# Patient Record
Sex: Female | Born: 1990 | Race: Black or African American | Hispanic: No | Marital: Single | Smoking: Never smoker
Health system: Southern US, Community
[De-identification: ages and names within clinical notes are randomized; demographics above are authoritative.]

## PROBLEM LIST (undated history)

## (undated) ENCOUNTER — Emergency Department (HOSPITAL_BASED_OUTPATIENT_CLINIC_OR_DEPARTMENT_OTHER): Admission: EM | Payer: Medicaid Other | Source: Home / Self Care

## (undated) DIAGNOSIS — G43909 Migraine, unspecified, not intractable, without status migrainosus: Secondary | ICD-10-CM

## (undated) DIAGNOSIS — I10 Essential (primary) hypertension: Secondary | ICD-10-CM

## (undated) HISTORY — PX: OTHER SURGICAL HISTORY: SHX169

## (undated) HISTORY — PX: HAND SURGERY: SHX662

## (undated) HISTORY — PX: FRACTURE SURGERY: SHX138

---

## 2009-05-21 ENCOUNTER — Emergency Department (HOSPITAL_BASED_OUTPATIENT_CLINIC_OR_DEPARTMENT_OTHER): Admission: EM | Admit: 2009-05-21 | Discharge: 2009-05-21 | Payer: Self-pay | Admitting: Emergency Medicine

## 2009-06-19 ENCOUNTER — Emergency Department (HOSPITAL_BASED_OUTPATIENT_CLINIC_OR_DEPARTMENT_OTHER): Admission: EM | Admit: 2009-06-19 | Discharge: 2009-06-19 | Payer: Self-pay | Admitting: Emergency Medicine

## 2009-12-19 ENCOUNTER — Emergency Department (HOSPITAL_BASED_OUTPATIENT_CLINIC_OR_DEPARTMENT_OTHER): Admission: EM | Admit: 2009-12-19 | Discharge: 2009-12-19 | Payer: Self-pay | Admitting: Emergency Medicine

## 2010-03-10 ENCOUNTER — Emergency Department (HOSPITAL_BASED_OUTPATIENT_CLINIC_OR_DEPARTMENT_OTHER): Admission: EM | Admit: 2010-03-10 | Discharge: 2010-03-10 | Payer: Self-pay | Admitting: Emergency Medicine

## 2010-10-23 ENCOUNTER — Emergency Department (HOSPITAL_BASED_OUTPATIENT_CLINIC_OR_DEPARTMENT_OTHER): Admission: EM | Admit: 2010-10-23 | Discharge: 2010-10-23 | Payer: Self-pay | Admitting: Emergency Medicine

## 2011-02-13 ENCOUNTER — Emergency Department (HOSPITAL_BASED_OUTPATIENT_CLINIC_OR_DEPARTMENT_OTHER)
Admission: EM | Admit: 2011-02-13 | Discharge: 2011-02-13 | Disposition: A | Discharge status: Home / Self Care | Payer: Medicaid Other | Attending: Emergency Medicine | Admitting: Emergency Medicine

## 2011-02-13 DIAGNOSIS — O2390 Unspecified genitourinary tract infection in pregnancy, unspecified trimester: Secondary | ICD-10-CM | POA: Insufficient documentation

## 2011-02-13 DIAGNOSIS — N390 Urinary tract infection, site not specified: Secondary | ICD-10-CM | POA: Insufficient documentation

## 2011-02-13 DIAGNOSIS — R509 Fever, unspecified: Secondary | ICD-10-CM | POA: Insufficient documentation

## 2011-02-13 LAB — DIFFERENTIAL
Eosinophils Absolute: 0.1 10*3/uL (ref 0.0–0.7)
Lymphocytes Relative: 6 % — ABNORMAL LOW (ref 12–46)
Lymphs Abs: 0.6 10*3/uL — ABNORMAL LOW (ref 0.7–4.0)
Neutrophils Relative %: 87 % — ABNORMAL HIGH (ref 43–77)

## 2011-02-13 LAB — URINALYSIS, ROUTINE W REFLEX MICROSCOPIC
Bilirubin Urine: NEGATIVE
Hgb urine dipstick: NEGATIVE
Ketones, ur: 40 mg/dL — AB
Protein, ur: NEGATIVE mg/dL
Urobilinogen, UA: 1 mg/dL (ref 0.0–1.0)

## 2011-02-13 LAB — BASIC METABOLIC PANEL
BUN: 6 mg/dL (ref 6–23)
CO2: 21 mEq/L (ref 19–32)
Chloride: 108 mEq/L (ref 96–112)
Creatinine, Ser: 0.6 mg/dL (ref 0.4–1.2)
Potassium: 3.5 mEq/L (ref 3.5–5.1)

## 2011-02-13 LAB — CBC
HCT: 29.5 % — ABNORMAL LOW (ref 36.0–46.0)
Hemoglobin: 10.1 g/dL — ABNORMAL LOW (ref 12.0–15.0)
MCH: 26.8 pg (ref 26.0–34.0)
MCV: 78.2 fL (ref 78.0–100.0)
Platelets: 176 10*3/uL (ref 150–400)
RBC: 3.77 MIL/uL — ABNORMAL LOW (ref 3.87–5.11)
WBC: 9.6 10*3/uL (ref 4.0–10.5)

## 2011-02-13 LAB — URINE MICROSCOPIC-ADD ON

## 2011-02-16 LAB — URINE CULTURE
Colony Count: >=100000
Culture  Setup Time: 201203061939

## 2011-02-20 LAB — URINALYSIS, ROUTINE W REFLEX MICROSCOPIC
Bilirubin Urine: NEGATIVE
Glucose, UA: NEGATIVE mg/dL
Hgb urine dipstick: NEGATIVE
Ketones, ur: 15 mg/dL — AB
Protein, ur: NEGATIVE mg/dL

## 2011-02-20 LAB — BASIC METABOLIC PANEL
BUN: 9 mg/dL (ref 6–23)
CO2: 25 mEq/L (ref 19–32)
Chloride: 103 mEq/L (ref 96–112)
Creatinine, Ser: 0.7 mg/dL (ref 0.4–1.2)
Glucose, Bld: 103 mg/dL — ABNORMAL HIGH (ref 70–99)

## 2011-02-20 LAB — DIFFERENTIAL
Basophils Absolute: 0 10*3/uL (ref 0.0–0.1)
Eosinophils Absolute: 0.1 10*3/uL (ref 0.0–0.7)
Eosinophils Relative: 2 % (ref 0–5)
Monocytes Absolute: 0.5 10*3/uL (ref 0.1–1.0)

## 2011-02-20 LAB — CBC
HCT: 35.9 % — ABNORMAL LOW (ref 36.0–46.0)
MCH: 27.7 pg (ref 26.0–34.0)
MCV: 82.3 fL (ref 78.0–100.0)
Platelets: 222 10*3/uL (ref 150–400)
RDW: 12.7 % (ref 11.5–15.5)

## 2011-02-25 LAB — COMPREHENSIVE METABOLIC PANEL
ALT: 19 U/L (ref 0–35)
Alkaline Phosphatase: 45 U/L (ref 39–117)
BUN: 7 mg/dL (ref 6–23)
CO2: 28 mEq/L (ref 19–32)
GFR calc non Af Amer: >60 mL/min (ref >60)
Glucose, Bld: 120 mg/dL — ABNORMAL HIGH (ref 70–99)
Potassium: 3.6 mEq/L (ref 3.5–5.1)
Sodium: 139 mEq/L (ref 135–145)
Total Bilirubin: 0.3 mg/dL (ref 0.3–1.2)
Total Protein: 6.9 g/dL (ref 6.0–8.3)

## 2011-02-25 LAB — GC/CHLAMYDIA PROBE AMP, GENITAL: Chlamydia, DNA Probe: NEGATIVE

## 2011-02-25 LAB — URINALYSIS, ROUTINE W REFLEX MICROSCOPIC
Bilirubin Urine: NEGATIVE
Hgb urine dipstick: NEGATIVE
Ketones, ur: NEGATIVE mg/dL
Nitrite: NEGATIVE
Urobilinogen, UA: 1 mg/dL (ref 0.0–1.0)
pH: 7 (ref 5.0–8.0)

## 2011-02-25 LAB — WET PREP, GENITAL
Trich, Wet Prep: NONE SEEN
Yeast Wet Prep HPF POC: NONE SEEN

## 2011-02-25 LAB — DIFFERENTIAL
Basophils Absolute: 0.1 10*3/uL (ref 0.0–0.1)
Basophils Relative: 2 % — ABNORMAL HIGH (ref 0–1)
Eosinophils Absolute: 0.1 10*3/uL (ref 0.0–0.7)
Neutro Abs: 3 10*3/uL (ref 1.7–7.7)
Neutrophils Relative %: 67 % (ref 43–77)

## 2011-02-25 LAB — CBC
HCT: 34.3 % — ABNORMAL LOW (ref 36.0–46.0)
Hemoglobin: 11.7 g/dL — ABNORMAL LOW (ref 12.0–15.0)
RBC: 4.17 MIL/uL (ref 3.87–5.11)
RDW: 13.2 % (ref 11.5–15.5)

## 2011-02-25 LAB — LIPASE, BLOOD: Lipase: 29 U/L (ref 23–300)

## 2011-02-25 LAB — URINE CULTURE

## 2011-02-25 LAB — URINE MICROSCOPIC-ADD ON

## 2011-02-25 LAB — PREGNANCY, URINE: Preg Test, Ur: NEGATIVE

## 2011-02-28 LAB — RAPID STREP SCREEN (MED CTR MEBANE ONLY): Streptococcus, Group A Screen (Direct): NEGATIVE

## 2011-03-18 LAB — URINE MICROSCOPIC-ADD ON

## 2011-03-18 LAB — URINALYSIS, ROUTINE W REFLEX MICROSCOPIC
Bilirubin Urine: NEGATIVE
Glucose, UA: NEGATIVE mg/dL
Protein, ur: NEGATIVE mg/dL
Urobilinogen, UA: 0.2 mg/dL (ref 0.0–1.0)

## 2011-04-06 ENCOUNTER — Emergency Department (HOSPITAL_BASED_OUTPATIENT_CLINIC_OR_DEPARTMENT_OTHER)
Admission: EM | Admit: 2011-04-06 | Discharge: 2011-04-06 | Disposition: A | Discharge status: Home / Self Care | Payer: Medicaid Other | Attending: Emergency Medicine | Admitting: Emergency Medicine

## 2011-04-06 DIAGNOSIS — O2690 Pregnancy related conditions, unspecified, unspecified trimester: Secondary | ICD-10-CM | POA: Insufficient documentation

## 2011-04-06 DIAGNOSIS — R42 Dizziness and giddiness: Secondary | ICD-10-CM | POA: Insufficient documentation

## 2011-04-06 LAB — BASIC METABOLIC PANEL
BUN: 7 mg/dL (ref 6–23)
CO2: 25 mEq/L (ref 19–32)
Chloride: 105 mEq/L (ref 96–112)
GFR calc Af Amer: >60 mL/min (ref >60)
Potassium: 3.6 mEq/L (ref 3.5–5.1)

## 2011-04-06 LAB — DIFFERENTIAL
Eosinophils Absolute: 0.2 10*3/uL (ref 0.0–0.7)
Lymphs Abs: 1.2 10*3/uL (ref 0.7–4.0)
Monocytes Relative: 7 % (ref 3–12)
Neutro Abs: 8.4 10*3/uL — ABNORMAL HIGH (ref 1.7–7.7)
Neutrophils Relative %: 79 % — ABNORMAL HIGH (ref 43–77)

## 2011-04-06 LAB — URINE MICROSCOPIC-ADD ON

## 2011-04-06 LAB — URINALYSIS, ROUTINE W REFLEX MICROSCOPIC
Ketones, ur: 15 mg/dL — AB
Nitrite: NEGATIVE
Protein, ur: NEGATIVE mg/dL
pH: 7 (ref 5.0–8.0)

## 2011-04-06 LAB — CBC
Hemoglobin: 10.5 g/dL — ABNORMAL LOW (ref 12.0–15.0)
MCH: 26.7 pg (ref 26.0–34.0)
MCV: 78.6 fL (ref 78.0–100.0)
Platelets: 186 10*3/uL (ref 150–400)
RBC: 3.93 MIL/uL (ref 3.87–5.11)
WBC: 10.6 10*3/uL — ABNORMAL HIGH (ref 4.0–10.5)

## 2011-04-09 LAB — GLUCOSE, CAPILLARY: Glucose-Capillary: 146 mg/dL — ABNORMAL HIGH (ref 70–99)

## 2012-02-18 ENCOUNTER — Emergency Department (HOSPITAL_BASED_OUTPATIENT_CLINIC_OR_DEPARTMENT_OTHER)
Admission: EM | Admit: 2012-02-18 | Discharge: 2012-02-18 | Disposition: A | Discharge status: Home / Self Care | Payer: Medicaid Other | Attending: Emergency Medicine | Admitting: Emergency Medicine

## 2012-02-18 ENCOUNTER — Encounter (HOSPITAL_BASED_OUTPATIENT_CLINIC_OR_DEPARTMENT_OTHER): Payer: Self-pay | Admitting: *Deleted

## 2012-02-18 DIAGNOSIS — L299 Pruritus, unspecified: Secondary | ICD-10-CM | POA: Insufficient documentation

## 2012-02-18 DIAGNOSIS — R221 Localized swelling, mass and lump, neck: Secondary | ICD-10-CM | POA: Insufficient documentation

## 2012-02-18 DIAGNOSIS — R220 Localized swelling, mass and lump, head: Secondary | ICD-10-CM | POA: Insufficient documentation

## 2012-02-18 DIAGNOSIS — L259 Unspecified contact dermatitis, unspecified cause: Secondary | ICD-10-CM | POA: Insufficient documentation

## 2012-02-18 MED ORDER — PREDNISONE 10 MG PO TABS: ORAL_TABLET | ORAL | Status: DC

## 2012-02-18 MED ORDER — DEXAMETHASONE SODIUM PHOSPHATE 10 MG/ML IJ SOLN
10.0000 mg | Freq: Once | INTRAMUSCULAR | Status: AC
  Administered 2012-02-18: 10 mg via INTRAMUSCULAR
  Filled 2012-02-18: qty 1

## 2012-02-18 MED ORDER — DIPHENHYDRAMINE HCL 50 MG PO CAPS
50.0000 mg | ORAL_CAPSULE | Freq: Once | ORAL | Status: DC
  Filled 2012-02-18: qty 1

## 2012-02-18 MED ORDER — DIPHENHYDRAMINE HCL 25 MG PO CAPS
25.0000 mg | ORAL_CAPSULE | Freq: Once | ORAL | Status: AC
  Administered 2012-02-18: 25 mg via ORAL
  Filled 2012-02-18: qty 1

## 2012-02-18 MED ORDER — DIPHENHYDRAMINE HCL 25 MG PO CAPS
ORAL_CAPSULE | ORAL | Status: AC
  Administered 2012-02-18: 25 mg via ORAL
  Filled 2012-02-18: qty 2

## 2012-02-18 NOTE — Discharge Instructions (Signed)
Contact Dermatitis  Contact dermatitis is a rash that happens when something touches the skin. You touched something that irritates your skin, or you have allergies to something you touched.  HOME CARE    Avoid the thing that caused your rash.   Keep your rash away from hot water, soap, sunlight, chemicals, and other things that might bother it.   Do not scratch your rash.   You can take cool baths to help stop itching.   Only take medicine as told by your doctor.   Keep all doctor visits as told.  GET HELP RIGHT AWAY IF:    Your rash is not better after 3 days.   Your rash gets worse.   Your rash is puffy (swollen), tender, red, sore, or warm.   You have problems with your medicine.  MAKE SURE YOU:    Understand these instructions.   Will watch your condition.   Will get help right away if you are not doing well or get worse.  Document Released: 09/23/2009 Document Revised: 11/15/2011 Document Reviewed: 05/01/2011  [employer] Patient Information 2012 [employer].

## 2012-02-18 NOTE — ED Notes (Signed)
Pt amb to room 1 with quick steady gait smiling in nad. Pt reports having her eyebrows waxed Saturday, and has had swelling and redness with rash around her eyes since then. Denies any throat, tongue swelling or resp problems.

## 2012-02-18 NOTE — ED Provider Notes (Signed)
History     CSN: [id number]  Arrival date & time 02/18/12  0845   First MD Initiated Contact with Patient 02/18/12 [phone]      Chief Complaint  Patient presents with  . Rash  . Facial Swelling    (Consider location/radiation/quality/duration/timing/severity/associated sxs/prior treatment) HPI Comments: Patient complains of itching, redness, and facial swelling that began 2 days ago. She states the symptoms began after he she had her eyebrows waxed.  States she she noticed itching first with mild swelling last evening and states that when she woke up this morning the swelling had progressed.  She states that she has been applying witch hazel without improvement of her symptoms. She denies taking any Benadryl at home. She also denies any difficulty with breathing or swallowing she also denies any swelling of her lips or tongue.  Patient is a 21 y.o. female presenting with rash. The history is provided by the patient. No language interpreter was used.  Rash  This is a new problem. The current episode started 2 days ago. The problem has been gradually worsening. Associated with: Eyebrow wax. There has been no fever. The rash is present on the face. The patient is experiencing no pain. Associated symptoms include itching. Pertinent negatives include no blisters, no pain and no weeping. Treatments tried: Witch hazel. The treatment provided no relief.    History reviewed. No pertinent past medical history.  History reviewed. No pertinent past surgical history.  History reviewed. No pertinent family history.  History  Substance Use Topics  . Smoking status: Never Smoker   . Smokeless tobacco: Not on file  . Alcohol Use:     OB History    Grav Para Term Preterm Abortions TAB SAB Ect Mult Living                  Review of Systems  Constitutional: Negative for fever and chills.  HENT: Positive for facial swelling. Negative for congestion, sore throat, rhinorrhea, mouth sores, trouble  swallowing, neck pain and neck stiffness.   Eyes: Negative for redness and visual disturbance.  Respiratory: Negative for cough, chest tightness, shortness of breath and wheezing.   Cardiovascular: Negative for chest pain.  Gastrointestinal: Negative for nausea and vomiting.  Genitourinary: Negative for dysuria.  Musculoskeletal: Negative for myalgias and arthralgias.  Skin: Positive for color change, itching and rash.  Neurological: Negative for dizziness, weakness and numbness.  Hematological: Does not bruise/bleed easily.  All other systems reviewed and are negative.    Allergies  Codeine  Home Medications  No current outpatient prescriptions on file.  BP 129/81  Pulse 73  Temp(Src) 97.5 F (36.4 C) (Oral)  Resp 20  SpO2 100%  Physical Exam  Nursing note and vitals reviewed. Constitutional: She is oriented to person, place, and time. She appears well-developed and well-nourished. No distress.  HENT:  Head: Normocephalic and atraumatic. No trismus in the jaw.    Right Ear: Tympanic membrane and ear canal normal.  Left Ear: Tympanic membrane and ear canal normal.  Mouth/Throat: Uvula is midline, oropharynx is clear and moist and mucous membranes are normal. No uvula swelling.       Mild to moderate erythema and slight edema of the periorbital area bilaterally.  Remaining face is spared  Eyes: EOM are normal. Pupils are equal, round, and reactive to light.  Neck: Normal range of motion. Neck supple.  Cardiovascular: Normal rate, regular rhythm, normal heart sounds and intact distal pulses.   No murmur heard. Pulmonary/Chest: Effort  normal and breath sounds normal. No respiratory distress.  Musculoskeletal: Normal range of motion. She exhibits no tenderness.  Lymphadenopathy:    She has no cervical adenopathy.  Neurological: She is alert and oriented to person, place, and time. No cranial nerve deficit. She exhibits normal muscle tone. Coordination normal.  Skin: Skin is  warm and dry.       See HENT exam    ED Course  Procedures (including critical care time)       MDM    The patient is stable, no acute distress, she has mild erythema and swelling of the. Orbital area bilaterally. Airway is patent. No edema of the tongue or lips.  I will treat with Decadron, Benadryl and close observation.   10:10 AM patient is feeling better.  I have observed for approximately one hour, patient remains comfortable no further edema, airway remains patent. Symptoms are improving. She agrees to continue Benadryl for 3-5 days, I will prescribe steroids, she agrees to return to the ER for any worsening symptoms.  Patient / Family / Caregiver understand and agree with initial ED impression and plan with expectations set for ED visit. Pt stable in ED with no significant deterioration in condition. Pt feels improved after observation and/or treatment in ED.     Loyce Flaming L. Trisha Mangle, [place] 02/20/12 2013

## 2012-02-22 NOTE — ED Provider Notes (Signed)
Medical screening examination/treatment/procedure(s) were performed by non-physician practitioner and as supervising physician I was immediately available for consultation/collaboration.   Shelda Jakes, MD 02/22/12 [phone]

## 2012-09-23 ENCOUNTER — Encounter (HOSPITAL_BASED_OUTPATIENT_CLINIC_OR_DEPARTMENT_OTHER): Payer: Self-pay | Admitting: Emergency Medicine

## 2012-09-23 ENCOUNTER — Emergency Department (HOSPITAL_BASED_OUTPATIENT_CLINIC_OR_DEPARTMENT_OTHER)
Admission: EM | Admit: 2012-09-23 | Discharge: 2012-09-23 | Disposition: A | Discharge status: Home / Self Care | Payer: Self-pay | Attending: Emergency Medicine | Admitting: Emergency Medicine

## 2012-09-23 DIAGNOSIS — J020 Streptococcal pharyngitis: Secondary | ICD-10-CM | POA: Insufficient documentation

## 2012-09-23 DIAGNOSIS — Z885 Allergy status to narcotic agent status: Secondary | ICD-10-CM | POA: Insufficient documentation

## 2012-09-23 MED ORDER — HYDROCODONE-ACETAMINOPHEN 7.5-325 MG/15ML PO SOLN: 10.0000 mL | Freq: Four times a day (QID) | ORAL | Status: DC | PRN

## 2012-09-23 MED ORDER — PENICILLIN G BENZATHINE 1200000 UNIT/2ML IM SUSP
1.2000 10*6.[IU] | Freq: Once | INTRAMUSCULAR | Status: AC
  Administered 2012-09-23: 1.2 10*6.[IU] via INTRAMUSCULAR
  Filled 2012-09-23: qty 2

## 2012-09-23 NOTE — ED Provider Notes (Signed)
History     CSN: [id number]  Arrival date & time 09/23/12  0040   First MD Initiated Contact with Patient 09/23/12 0054      Chief Complaint  Patient presents with  . Sore Throat    (Consider location/radiation/quality/duration/timing/severity/associated sxs/prior treatment) HPI This is a 21 year old black female with a one-day history of ears and throat itching. About 7 PM yesterday evening the itching in her throat became a sore throat. She now states her throat hurts "really really bad" and hurts "really really bad" to swallow. She states her nose is congested "really really bad". She has had body aches, occasional cough but no dyspnea, nausea, vomiting or diarrhea. She's not aware of having a fever. She has not taken anything for her symptoms.  History reviewed. No pertinent past medical history.  Past Surgical History  Procedure Date  . Fracture surgery     No family history on file.  History  Substance Use Topics  . Smoking status: Never Smoker   . Smokeless tobacco: Not on file  . Alcohol Use:     OB History    Grav Para Term Preterm Abortions TAB SAB Ect Mult Living                  Review of Systems  All other systems reviewed and are negative.    Allergies  Codeine  Home Medications  No current outpatient prescriptions on file.  BP 137/95  Pulse 68  Temp 98.3 F (36.8 C) (Oral)  Resp 16  Ht 4\' 11"  (1.499 m)  Wt 170 lb (77.111 kg)  BMI 34.34 kg/m2  SpO2 100%  LMP 09/17/2012  Physical Exam General: Well-developed, well-nourished female in no acute distress; appearance consistent with age of record HENT: normocephalic, atraumatic; tympanic membranes normal appearance; no pharyngeal erythema or exudate; nasal congestion Eyes: pupils equal round and reactive to light; extraocular muscles intact Neck: supple; no lymphadenopathy Heart: regular rate and rhythm Lungs: clear to auscultation bilaterally Abdomen: soft; nondistended; nontender; bowel  sounds present Extremities: No deformity; full range of motion Neurologic: Awake, alert and oriented; motor function intact in all extremities and symmetric; no facial droop Skin: Warm and dry      ED Course  Procedures (including critical care time)     MDM   Nursing notes and vitals signs, including pulse oximetry, reviewed.  Summary of this visit's results, reviewed by myself:  Labs:  Results for orders placed during the hospital encounter of 09/23/12  RAPID STREP SCREEN      Component Value Range   Streptococcus, Group A Screen (Direct) POSITIVE (*) NEGATIVE            Hanley Seamen, MD 09/23/12 [phone]

## 2012-09-23 NOTE — ED Notes (Signed)
C/o sore throat, body aches, and nasal congestion that started a few hours ago.

## 2012-11-19 ENCOUNTER — Encounter (HOSPITAL_BASED_OUTPATIENT_CLINIC_OR_DEPARTMENT_OTHER): Payer: Self-pay | Admitting: Family Medicine

## 2012-11-19 ENCOUNTER — Emergency Department (HOSPITAL_BASED_OUTPATIENT_CLINIC_OR_DEPARTMENT_OTHER)
Admission: EM | Admit: 2012-11-19 | Discharge: 2012-11-19 | Disposition: A | Discharge status: Home / Self Care | Payer: Self-pay | Attending: Emergency Medicine | Admitting: Emergency Medicine

## 2012-11-19 DIAGNOSIS — R42 Dizziness and giddiness: Secondary | ICD-10-CM | POA: Insufficient documentation

## 2012-11-19 DIAGNOSIS — R51 Headache: Secondary | ICD-10-CM | POA: Insufficient documentation

## 2012-11-19 DIAGNOSIS — M542 Cervicalgia: Secondary | ICD-10-CM | POA: Insufficient documentation

## 2012-11-19 DIAGNOSIS — N390 Urinary tract infection, site not specified: Secondary | ICD-10-CM | POA: Insufficient documentation

## 2012-11-19 DIAGNOSIS — R05 Cough: Secondary | ICD-10-CM | POA: Insufficient documentation

## 2012-11-19 DIAGNOSIS — Z3202 Encounter for pregnancy test, result negative: Secondary | ICD-10-CM | POA: Insufficient documentation

## 2012-11-19 DIAGNOSIS — R059 Cough, unspecified: Secondary | ICD-10-CM | POA: Insufficient documentation

## 2012-11-19 DIAGNOSIS — Z79899 Other long term (current) drug therapy: Secondary | ICD-10-CM | POA: Insufficient documentation

## 2012-11-19 DIAGNOSIS — Z791 Long term (current) use of non-steroidal anti-inflammatories (NSAID): Secondary | ICD-10-CM | POA: Insufficient documentation

## 2012-11-19 DIAGNOSIS — M549 Dorsalgia, unspecified: Secondary | ICD-10-CM | POA: Insufficient documentation

## 2012-11-19 DIAGNOSIS — R112 Nausea with vomiting, unspecified: Secondary | ICD-10-CM | POA: Insufficient documentation

## 2012-11-19 LAB — BASIC METABOLIC PANEL
BUN: 8 mg/dL (ref 6–23)
CO2: 26 mEq/L (ref 19–32)
Chloride: 101 mEq/L (ref 96–112)
GFR calc non Af Amer: >90 mL/min (ref >90)
Glucose, Bld: 126 mg/dL — ABNORMAL HIGH (ref 70–99)
Potassium: 3.5 mEq/L (ref 3.5–5.1)
Sodium: 138 mEq/L (ref 135–145)

## 2012-11-19 LAB — URINALYSIS, ROUTINE W REFLEX MICROSCOPIC
Glucose, UA: NEGATIVE mg/dL
Specific Gravity, Urine: 1.017 (ref 1.005–1.030)
Urobilinogen, UA: 1 mg/dL (ref 0.0–1.0)
pH: 6 (ref 5.0–8.0)

## 2012-11-19 LAB — CBC WITH DIFFERENTIAL/PLATELET
Hemoglobin: 12.9 g/dL (ref 12.0–15.0)
Lymphocytes Relative: 5 % — ABNORMAL LOW (ref 12–46)
Lymphs Abs: 0.5 10*3/uL — ABNORMAL LOW (ref 0.7–4.0)
Monocytes Relative: 7 % (ref 3–12)
Neutrophils Relative %: 89 % — ABNORMAL HIGH (ref 43–77)
Platelets: 216 10*3/uL (ref 150–400)
RBC: 4.78 MIL/uL (ref 3.87–5.11)
WBC: 10.6 10*3/uL — ABNORMAL HIGH (ref 4.0–10.5)

## 2012-11-19 LAB — URINE MICROSCOPIC-ADD ON

## 2012-11-19 LAB — PREGNANCY, URINE: Preg Test, Ur: NEGATIVE

## 2012-11-19 MED ORDER — SODIUM CHLORIDE 0.9 % IV BOLUS (SEPSIS)
1000.0000 mL | Freq: Once | INTRAVENOUS | Status: AC
  Administered 2012-11-19: 1000 mL via INTRAVENOUS

## 2012-11-19 MED ORDER — CIPROFLOXACIN HCL 500 MG PO TABS: 500.0000 mg | ORAL_TABLET | Freq: Two times a day (BID) | ORAL | Status: DC

## 2012-11-19 MED ORDER — KETOROLAC TROMETHAMINE 30 MG/ML IJ SOLN
30.0000 mg | Freq: Once | INTRAMUSCULAR | Status: AC
  Administered 2012-11-19: 30 mg via INTRAVENOUS
  Filled 2012-11-19: qty 1

## 2012-11-19 MED ORDER — OXYCODONE-ACETAMINOPHEN 5-325 MG PO TABS
ORAL_TABLET | ORAL | Status: AC
  Administered 2012-11-19: 2
  Filled 2012-11-19: qty 2

## 2012-11-19 MED ORDER — HYDROCODONE-ACETAMINOPHEN 5-325 MG PO TABS: 2.0000 | ORAL_TABLET | ORAL | Status: DC | PRN

## 2012-11-19 MED ORDER — ONDANSETRON HCL 4 MG/2ML IJ SOLN
4.0000 mg | Freq: Once | INTRAMUSCULAR | Status: AC
  Administered 2012-11-19: 4 mg via INTRAVENOUS
  Filled 2012-11-19: qty 2

## 2012-11-19 MED ORDER — DEXTROSE 5 % IV SOLN
1.0000 g | Freq: Once | INTRAVENOUS | Status: AC
  Administered 2012-11-19: 1 g via INTRAVENOUS
  Filled 2012-11-19: qty 10

## 2012-11-19 NOTE — ED Notes (Signed)
Pt also reports generalized body aches and chills unrelieved after taking Ibuprofen

## 2012-11-19 NOTE — ED Notes (Signed)
MD at bedside. 

## 2012-11-19 NOTE — ED Provider Notes (Signed)
History     CSN: [id number]  Arrival date & time 11/19/12  6644   First MD Initiated Contact with Patient 11/19/12 [phone]      Chief Complaint  Patient presents with  . Fever  . Emesis    (Consider location/radiation/quality/duration/timing/severity/associated sxs/prior treatment) HPI Comments: Patient complaints with taking this and chills since yesterday. She started having some chills and a bifrontal headache yesterday. She states she has shooting pains in her neck but no persistent neck pain or stiffness. She has some pain across her lower back. She has no abdominal pain other than some epigastric discomfort. She's had some nausea and vomiting. She feels tired and slightly dizzy. She denies any urinary symptoms. She's had a slight cough but no chest congestion. Denies any runny nose or nasal discharge. Denies any known sick contacts. Denies any rashes. She's taken some ibuprofen without relief.  Patient is a 21 y.o. female presenting with fever and vomiting.  Fever Primary symptoms of the febrile illness include fever, headaches, cough, nausea and vomiting. Primary symptoms do not include fatigue, shortness of breath, abdominal pain, diarrhea, arthralgias or rash.  The headache is not associated with neck stiffness or weakness.   Emesis  Associated symptoms include chills, cough, a fever and headaches. Pertinent negatives include no abdominal pain, no arthralgias and no diarrhea.    History reviewed. No pertinent past medical history.  Past Surgical History  Procedure Date  . Fracture surgery   . Cesarean section     No family history on file.  History  Substance Use Topics  . Smoking status: Never Smoker   . Smokeless tobacco: Not on file  . Alcohol Use: No    OB History    Grav Para Term Preterm Abortions TAB SAB Ect Mult Living                  Review of Systems  Constitutional: Positive for fever and chills. Negative for diaphoresis and fatigue.  HENT:  Positive for neck pain. Negative for congestion, rhinorrhea, sneezing and neck stiffness.   Eyes: Negative.   Respiratory: Positive for cough. Negative for chest tightness and shortness of breath.   Cardiovascular: Negative for chest pain and leg swelling.  Gastrointestinal: Positive for nausea and vomiting. Negative for abdominal pain, diarrhea and blood in stool.  Genitourinary: Negative for frequency, hematuria, flank pain and difficulty urinating.  Musculoskeletal: Positive for back pain. Negative for arthralgias.  Skin: Negative for rash.  Neurological: Positive for dizziness and headaches. Negative for speech difficulty, weakness and numbness.    Allergies  Codeine  Home Medications   Current Outpatient Rx  Name  Route  Sig  Dispense  Refill  . IBUPROFEN 200 MG PO TABS   Oral   Take 800 mg by mouth every 6 (six) hours as needed.         Marland Kitchen CIPROFLOXACIN HCL 500 MG PO TABS   Oral   Take 1 tablet (500 mg total) by mouth 2 (two) times daily.   14 tablet   0   . HYDROCODONE-ACETAMINOPHEN 7.5-325 MG/15ML PO SOLN   Oral   Take 10 mLs by mouth 4 (four) times daily as needed for pain.   120 mL   0   . HYDROCODONE-ACETAMINOPHEN 5-325 MG PO TABS   Oral   Take 2 tablets by mouth every 4 (four) hours as needed for pain.   6 tablet   0     BP 130/75  Pulse 94  Temp 100  F (37.8 C) (Oral)  Resp 16  SpO2 99%  LMP 11/09/2012  Physical Exam  Constitutional: She is oriented to person, place, and time. She appears well-developed and well-nourished.  HENT:  Head: Normocephalic and atraumatic.  Mouth/Throat: Oropharynx is clear and moist.  Eyes: Pupils are equal, round, and reactive to light.  Neck: Normal range of motion. Neck supple.       No meningeal signs  Cardiovascular: Normal rate, regular rhythm and normal heart sounds.   Pulmonary/Chest: Effort normal and breath sounds normal. No respiratory distress. She has no wheezes. She has no rales. She exhibits no  tenderness.  Abdominal: Soft. Bowel sounds are normal. There is no tenderness. There is no rebound and no guarding.  Musculoskeletal: Normal range of motion. She exhibits no edema.  Lymphadenopathy:    She has no cervical adenopathy.  Neurological: She is alert and oriented to person, place, and time.  Skin: Skin is warm and dry. No rash noted.  Psychiatric: She has a normal mood and affect.    ED Course  Procedures (including critical care time)  Results for orders placed during the hospital encounter of 11/19/12  CBC WITH DIFFERENTIAL      Component Value Range   WBC 10.6 (*) 4.0 - 10.5 K/uL   RBC 4.78  3.87 - 5.11 MIL/uL   Hemoglobin 12.9  12.0 - 15.0 g/dL   HCT 62.1  30.8 - 65.7 %   MCV 79.3  78.0 - 100.0 fL   MCH 27.0  26.0 - 34.0 pg   MCHC 34.0  30.0 - 36.0 g/dL   RDW 84.6  96.2 - 95.2 %   Platelets 216  150 - 400 K/uL   Neutrophils Relative 89 (*) 43 - 77 %   Neutro Abs 9.4 (*) 1.7 - 7.7 K/uL   Lymphocytes Relative 5 (*) 12 - 46 %   Lymphs Abs 0.5 (*) 0.7 - 4.0 K/uL   Monocytes Relative 7  3 - 12 %   Monocytes Absolute 0.7  0.1 - 1.0 K/uL   Eosinophils Relative 0  0 - 5 %   Eosinophils Absolute 0.0  0.0 - 0.7 K/uL   Basophils Relative 0  0 - 1 %   Basophils Absolute 0.0  0.0 - 0.1 K/uL  BASIC METABOLIC PANEL      Component Value Range   Sodium 138  135 - 145 mEq/L   Potassium 3.5  3.5 - 5.1 mEq/L   Chloride 101  96 - 112 mEq/L   CO2 26  19 - 32 mEq/L   Glucose, Bld 126 (*) 70 - 99 mg/dL   BUN 8  6 - 23 mg/dL   Creatinine, Ser 8.41  0.50 - 1.10 mg/dL   Calcium 9.3  8.4 - 32.4 mg/dL   GFR calc non Af Amer >90  >90 mL/min   GFR calc Af Amer >90  >90 mL/min  URINALYSIS, ROUTINE W REFLEX MICROSCOPIC      Component Value Range   Color, Urine YELLOW  YELLOW   APPearance CLOUDY (*) CLEAR   Specific Gravity, Urine 1.017  1.005 - 1.030   pH 6.0  5.0 - 8.0   Glucose, UA NEGATIVE  NEGATIVE mg/dL   Hgb urine dipstick SMALL (*) NEGATIVE   Bilirubin Urine NEGATIVE   NEGATIVE   Ketones, ur 40 (*) NEGATIVE mg/dL   Protein, ur 401 (*) NEGATIVE mg/dL   Urobilinogen, UA 1.0  0.0 - 1.0 mg/dL   Nitrite POSITIVE (*) NEGATIVE  Leukocytes, UA MODERATE (*) NEGATIVE  PREGNANCY, URINE      Component Value Range   Preg Test, Ur NEGATIVE  NEGATIVE  URINE MICROSCOPIC-ADD ON      Component Value Range   Squamous Epithelial / LPF FEW (*) RARE   WBC, UA TOO NUMEROUS TO COUNT  <3 WBC/hpf   RBC / HPF 3-6  <3 RBC/hpf   Bacteria, UA MANY (*) RARE   No results found.    1. UTI (lower urinary tract infection)       MDM  Patient with evidence of UTI which is likely causing her symptoms. She does have a bifrontal-type headache but she has no other meningeal signs. She is nontoxic appearing and I feel that she can be treated as an outpatient. She has no ongoing vomiting. Her vital signs are stable. She was given dose or Rocephin here in the emergency department and I will give her prescription for Cipro and a small course of hydrocodone. Advised her followup with her primary care physician or return here if her symptoms are not improving or if she has any worsening symptoms        Rolan Bucco, MD 11/19/12 1119

## 2012-11-19 NOTE — ED Notes (Signed)
Family at bedside. 

## 2012-11-19 NOTE — ED Notes (Signed)
Pt c/o fever, n/v x 2 days with generalized body aches and "dizziness". Pt has multiple complaints. No vomiting in triage.

## 2012-11-20 ENCOUNTER — Encounter (HOSPITAL_BASED_OUTPATIENT_CLINIC_OR_DEPARTMENT_OTHER): Payer: Self-pay | Admitting: *Deleted

## 2012-11-20 ENCOUNTER — Emergency Department (HOSPITAL_BASED_OUTPATIENT_CLINIC_OR_DEPARTMENT_OTHER)
Admission: EM | Admit: 2012-11-20 | Discharge: 2012-11-21 | Disposition: A | Discharge status: Home / Self Care | Payer: Self-pay | Attending: Emergency Medicine | Admitting: Emergency Medicine

## 2012-11-20 ENCOUNTER — Emergency Department (HOSPITAL_BASED_OUTPATIENT_CLINIC_OR_DEPARTMENT_OTHER): Payer: Self-pay

## 2012-11-20 DIAGNOSIS — M542 Cervicalgia: Secondary | ICD-10-CM | POA: Insufficient documentation

## 2012-11-20 DIAGNOSIS — G43909 Migraine, unspecified, not intractable, without status migrainosus: Secondary | ICD-10-CM | POA: Insufficient documentation

## 2012-11-20 DIAGNOSIS — Z3202 Encounter for pregnancy test, result negative: Secondary | ICD-10-CM | POA: Insufficient documentation

## 2012-11-20 DIAGNOSIS — R509 Fever, unspecified: Secondary | ICD-10-CM | POA: Insufficient documentation

## 2012-11-20 LAB — BASIC METABOLIC PANEL
CO2: 26 mEq/L (ref 19–32)
Glucose, Bld: 97 mg/dL (ref 70–99)
Potassium: 3.2 mEq/L — ABNORMAL LOW (ref 3.5–5.1)
Sodium: 138 mEq/L (ref 135–145)

## 2012-11-20 LAB — URINE MICROSCOPIC-ADD ON

## 2012-11-20 LAB — CBC WITH DIFFERENTIAL/PLATELET
Eosinophils Relative: 0 % (ref 0–5)
Hemoglobin: 12.9 g/dL (ref 12.0–15.0)
Lymphocytes Relative: 11 % — ABNORMAL LOW (ref 12–46)
Lymphs Abs: 1.1 10*3/uL (ref 0.7–4.0)
MCV: 79.1 fL (ref 78.0–100.0)
Monocytes Relative: 9 % (ref 3–12)
Neutrophils Relative %: 80 % — ABNORMAL HIGH (ref 43–77)
Platelets: 213 10*3/uL (ref 150–400)
RBC: 4.78 MIL/uL (ref 3.87–5.11)
WBC: 9.9 10*3/uL (ref 4.0–10.5)

## 2012-11-20 LAB — URINALYSIS, ROUTINE W REFLEX MICROSCOPIC
Glucose, UA: NEGATIVE mg/dL
Specific Gravity, Urine: 1.018 (ref 1.005–1.030)

## 2012-11-20 LAB — PREGNANCY, URINE: Preg Test, Ur: NEGATIVE

## 2012-11-20 MED ORDER — METOCLOPRAMIDE HCL 5 MG/ML IJ SOLN
10.0000 mg | Freq: Once | INTRAMUSCULAR | Status: AC
  Administered 2012-11-20: 10 mg via INTRAVENOUS
  Filled 2012-11-20: qty 2

## 2012-11-20 MED ORDER — SODIUM CHLORIDE 0.9 % IV BOLUS (SEPSIS)
500.0000 mL | Freq: Once | INTRAVENOUS | Status: AC
  Administered 2012-11-20: 500 mL via INTRAVENOUS

## 2012-11-20 MED ORDER — LIDOCAINE-EPINEPHRINE 2 %-1:100000 IJ SOLN
20.0000 mL | Freq: Once | INTRAMUSCULAR | Status: AC
  Administered 2012-11-20: 20 mL

## 2012-11-20 MED ORDER — LACTATED RINGERS IV BOLUS (SEPSIS)
1000.0000 mL | Freq: Once | INTRAVENOUS | Status: AC
  Administered 2012-11-20: 1000 mL via INTRAVENOUS

## 2012-11-20 MED ORDER — ACETAMINOPHEN 325 MG PO TABS
ORAL_TABLET | ORAL | Status: AC
  Administered 2012-11-20: 650 mg via ORAL
  Filled 2012-11-20: qty 2

## 2012-11-20 MED ORDER — LIDOCAINE HCL (PF) 1 % IJ SOLN
INTRAMUSCULAR | Status: AC
  Filled 2012-11-20: qty 5

## 2012-11-20 MED ORDER — DIPHENHYDRAMINE HCL 50 MG/ML IJ SOLN
25.0000 mg | Freq: Once | INTRAMUSCULAR | Status: AC
  Administered 2012-11-20: 25 mg via INTRAVENOUS
  Filled 2012-11-20: qty 1

## 2012-11-20 MED ORDER — KETOROLAC TROMETHAMINE 15 MG/ML IJ SOLN
15.0000 mg | Freq: Once | INTRAMUSCULAR | Status: AC
  Administered 2012-11-20: 15 mg via INTRAVENOUS
  Filled 2012-11-20: qty 1

## 2012-11-20 MED ORDER — HYDROMORPHONE HCL PF 1 MG/ML IJ SOLN
0.5000 mg | Freq: Once | INTRAMUSCULAR | Status: AC
  Administered 2012-11-20: 0.5 mg via INTRAVENOUS

## 2012-11-20 MED ORDER — HYDROMORPHONE HCL PF 1 MG/ML IJ SOLN
INTRAMUSCULAR | Status: AC
  Administered 2012-11-20: 0.5 mg via INTRAVENOUS
  Filled 2012-11-20: qty 1

## 2012-11-20 MED ORDER — ACETAMINOPHEN 325 MG PO TABS
650.0000 mg | ORAL_TABLET | Freq: Once | ORAL | Status: AC
  Administered 2012-11-20: 650 mg via ORAL

## 2012-11-20 MED ORDER — LIDOCAINE-EPINEPHRINE 2 %-1:100000 IJ SOLN
INTRAMUSCULAR | Status: AC
  Administered 2012-11-20: 20 mL
  Filled 2012-11-20: qty 1

## 2012-11-20 NOTE — ED Notes (Signed)
MD at bedside. 

## 2012-11-20 NOTE — ED Notes (Signed)
Patient transported to CT 

## 2012-11-20 NOTE — ED Provider Notes (Signed)
History     CSN: [id number]  Arrival date & time 11/20/12  1901   First MD Initiated Contact with Patient 11/20/12 1911      Chief Complaint  Patient presents with  . Headache    (Consider location/radiation/quality/duration/timing/severity/associated sxs/prior treatment) HPI Destiny Pugh is a 21 y.o. female with a history of migraine headaches who presents today with headache. She's also presenting with fever to 101.8 in the emergency department, and 40s history of fever, she says she's had some neck pain which is worsened today along with a worsening headache. She says this is not her typical migraine headache. She says it starts in the left side of her neck which feels stiff, it radiates up to the left side of her head it is throbbing, 10 out of 10 and has been associated with some tingling in her head.  Patient denies any rhinorrhea, coryza, sore throat, sinus congestion. Last week she had a cough but it has resolved-it was nonproductive. She was seen in the ER yesterday, was treated with pain medicine for her headache as well as given a dose of Rocephin and sent home with ciprofloxacin.   History reviewed. No pertinent past medical history.  Past Surgical History  Procedure Date  . Fracture surgery   . Cesarean section     History reviewed. No pertinent family history.  History  Substance Use Topics  . Smoking status: Never Smoker   . Smokeless tobacco: Not on file  . Alcohol Use: No    OB History    Grav Para Term Preterm Abortions TAB SAB Ect Mult Living                  Review of Systems At least 10pt or greater review of systems completed and are negative except where specified in the HPI.  Allergies  Codeine  Home Medications   Current Outpatient Rx  Name  Route  Sig  Dispense  Refill  . CIPROFLOXACIN HCL 500 MG PO TABS   Oral   Take 1 tablet (500 mg total) by mouth 2 (two) times daily.   14 tablet   0   . IBUPROFEN 200 MG PO TABS   Oral  Take 800 mg by mouth every 6 (six) hours as needed.         Marland Kitchen HYDROCODONE-ACETAMINOPHEN 7.5-325 MG/15ML PO SOLN   Oral   Take 10 mLs by mouth 4 (four) times daily as needed for pain.   120 mL   0   . HYDROCODONE-ACETAMINOPHEN 5-325 MG PO TABS   Oral   Take 2 tablets by mouth every 4 (four) hours as needed for pain.   6 tablet   0     BP 136/93  Pulse 93  Temp 101.1 F (38.4 C) (Oral)  Resp 16  SpO2 98%  LMP 11/09/2012  Physical Exam  Nursing notes reviewed.  Electronic medical record reviewed. VITAL SIGNS:   Filed Vitals:   11/20/12 1906  BP: 136/93  Pulse: 93  Temp: 101.1 F (38.4 C)  TempSrc: Oral  Resp: 16  SpO2: 98%   CONSTITUTIONAL: Awake, oriented, appears non-toxic HENT: Atraumatic, normocephalic, oral mucosa pink and moist, airway patent. Nares patent without drainage. External ears normal. EYES: Conjunctiva clear, EOMI, PERRLA NECK: Trachea midline, non-tender, supple CARDIOVASCULAR: Normal heart rate, Normal rhythm, No murmurs, rubs, gallops PULMONARY/CHEST: Clear to auscultation, no rhonchi, wheezes, or rales. Symmetrical breath sounds. Non-tender. ABDOMINAL: Non-distended, soft, non-tender - no rebound or guarding.  BS normal. NEUROLOGIC:  Non-focal, moving all four extremities, no gross sensory or motor deficits, Facial sensation equal to light touch bilaterally.  Good muscle bulk in the masseter muscle and good lateral movement of the jaw.  Facial expressions equal and good strength with smile/frown and puffed cheeks.  Hearing grossly intact to finger rub test.  Uvula, tongue are midline with no deviation. Symmetrical palate elevation.  Trapezius and SCM muscles are 5/5 strength bilaterally.    EXTREMITIES: No clubbing, cyanosis, or edema SKIN: Warm, Dry, No erythema, No rash  ED Course  LUMBAR PUNCTURE Date/Time: 11/21/2012 12:10 AM Performed by: Jones Skene Authorized by: Jones Skene Consent: Verbal consent obtained. Written consent  obtained. Risks and benefits: risks, benefits and alternatives were discussed Consent given by: patient Patient understanding: patient states understanding of the procedure being performed Patient consent: the patient's understanding of the procedure matches consent given Procedure consent: procedure consent matches procedure scheduled Site marked: the operative site was marked Patient identity confirmed: verbally with patient Time out: Immediately prior to procedure a "time out" was called to verify the correct patient, procedure, equipment, support staff and site/side marked as required. Indications: evaluation for infection and evaluation for subarachnoid hemorrhage Anesthesia: local infiltration Local anesthetic: lidocaine 2% with epinephrine Anesthetic total: 12 ml Patient sedated: no Preparation: Patient was prepped and draped in the usual sterile fashion. Lumbar space: L3-L4 interspace Patient's position: left lateral decubitus Needle gauge: 22 Needle type: spinal needle - Quincke tip Needle length: 3.5 in Number of attempts: 2 Opening pressure: 38 cm H2O Fluid appearance: clear Tubes of fluid: 4 Total volume: 4.5 ml Post-procedure: site cleaned and adhesive bandage applied Patient tolerance: Patient tolerated the procedure well with no immediate complications.   (including critical care time)  Labs Reviewed  CBC WITH DIFFERENTIAL - Abnormal; Notable for the following:    Neutrophils Relative 80 (*)     Neutro Abs 7.9 (*)     Lymphocytes Relative 11 (*)     All other components within normal limits  BASIC METABOLIC PANEL - Abnormal; Notable for the following:    Potassium 3.2 (*)     GFR calc non Af Amer 89 (*)     All other components within normal limits  URINALYSIS, ROUTINE W REFLEX MICROSCOPIC - Abnormal; Notable for the following:    APPearance CLOUDY (*)     Hgb urine dipstick LARGE (*)     Ketones, ur >80 (*)     Protein, ur 30 (*)     Leukocytes, UA SMALL  (*)     All other components within normal limits  URINE MICROSCOPIC-ADD ON - Abnormal; Notable for the following:    Squamous Epithelial / LPF FEW (*)     Bacteria, UA MANY (*)     All other components within normal limits  PROTEIN, CSF - Abnormal; Notable for the following:    Total  Protein, CSF 11 (*)     All other components within normal limits  PREGNANCY, URINE  GLUCOSE, CSF  URINE CULTURE  CSF CELL COUNT WITH DIFFERENTIAL  CSF CULTURE  GRAM STAIN   Ct Head Wo Contrast  11/20/2012  *RADIOLOGY REPORT*  Clinical Data:  Headache for the past 4 days.  CT HEAD WITHOUT CONTRAST  Technique: Contiguous axial images were obtained from the base of the skull through the vertex without contrast.  Comparison: None.  Findings: Normal appearing cerebral hemispheres and posterior fossa structures.  Normal size and position of the ventricles.  No intracranial hemorrhage, mass lesion or  evidence of acute infarction.  Unremarkable bones and included portions of the paranasal sinuses.  IMPRESSION: Normal examination.   Original Report Authenticated By: Beckie Salts, M.D.      1. Migraine   2. Fever   3. Neck pain       MDM  Anwen Watts is a 21 y.o. female Modena Jansky to the emergency department for the second time in 2 days with headache, neck pain and fever-at this point, I am concerned for meningitis - possibly viral, we'll also rule out subarachnoid hemorrhage with her atypical headache. Patient does have a gradual-type headache since more consistent with her migraine headaches has been exacerbated by either a viral illness or possible viral meningitis.  CT is unremarkable.  Labs are unremarkable, his test is negative, urine is contaminated with squamous epithelials-does have small leukocytes many bacteria 11-20 white cells, no nitrites-she is asymptomatic.  Will like not to treat at this time, urine has been cultured.  LP performed with a increased opening pressure, glucose is normal at 65  the total protein is actually reduced at 11. At the time of this dictation, cell count is still pending.  Patient's headache before LP was gone it came back a little bit after the headache and was completely relieved.  Care of patient transferred to Dr. Judd Lien - please see his note for final disposition.           Jones Skene, MD 11/21/12 0981

## 2012-11-20 NOTE — ED Notes (Signed)
Pt seen here yesterday for h/a, fever, chills x 4 days, headache has intensified, pt prescribed vicodin but reports allergy(nausea/vomiting) to codeine so did not want to take them. Pain in on left side head radiating to neck.

## 2012-11-21 LAB — CSF CELL COUNT WITH DIFFERENTIAL: Tube #: 4

## 2012-11-21 LAB — URINE CULTURE: Colony Count: >=100000

## 2012-11-21 MED ORDER — PROCHLORPERAZINE MALEATE 10 MG PO TABS: 10.0000 mg | ORAL_TABLET | Freq: Two times a day (BID) | ORAL | Status: DC | PRN

## 2012-11-21 MED ORDER — HYDROCODONE-ACETAMINOPHEN 5-500 MG PO TABS: 1.0000 | ORAL_TABLET | Freq: Four times a day (QID) | ORAL | Status: AC | PRN

## 2012-11-21 NOTE — ED Provider Notes (Signed)
Care assumed from Dr. Rulon Abide awaiting results of csf.  This returned clear with no evidence of SAH or meningitis.  She is feeling better and will be discharged with pain medication and compazine.  To follow up with neurology if not improving.  Geoffery Lyons, MD 11/21/12 0110

## 2012-11-22 NOTE — ED Notes (Signed)
+  Urine. Patient treated with Cipro. Sensitive to same. Per protocol MD. °

## 2012-11-24 LAB — CSF CULTURE W GRAM STAIN: Culture: NO GROWTH

## 2012-12-10 ENCOUNTER — Encounter (HOSPITAL_BASED_OUTPATIENT_CLINIC_OR_DEPARTMENT_OTHER): Payer: Self-pay | Admitting: *Deleted

## 2012-12-10 ENCOUNTER — Emergency Department (HOSPITAL_BASED_OUTPATIENT_CLINIC_OR_DEPARTMENT_OTHER)
Admission: EM | Admit: 2012-12-10 | Discharge: 2012-12-10 | Disposition: A | Discharge status: Home / Self Care | Payer: Self-pay | Attending: Emergency Medicine | Admitting: Emergency Medicine

## 2012-12-10 DIAGNOSIS — Z791 Long term (current) use of non-steroidal anti-inflammatories (NSAID): Secondary | ICD-10-CM | POA: Insufficient documentation

## 2012-12-10 DIAGNOSIS — L0291 Cutaneous abscess, unspecified: Secondary | ICD-10-CM

## 2012-12-10 DIAGNOSIS — Z79899 Other long term (current) drug therapy: Secondary | ICD-10-CM | POA: Insufficient documentation

## 2012-12-10 DIAGNOSIS — L02219 Cutaneous abscess of trunk, unspecified: Secondary | ICD-10-CM | POA: Insufficient documentation

## 2012-12-10 DIAGNOSIS — L03319 Cellulitis of trunk, unspecified: Secondary | ICD-10-CM | POA: Insufficient documentation

## 2012-12-10 MED ORDER — LIDOCAINE-EPINEPHRINE 2 %-1:100000 IJ SOLN
20.0000 mL | Freq: Once | INTRAMUSCULAR | Status: AC
  Administered 2012-12-10: 10:00:00 via INTRADERMAL

## 2012-12-10 MED ORDER — LIDOCAINE-EPINEPHRINE 2 %-1:100000 IJ SOLN
20.0000 mL | Freq: Once | INTRAMUSCULAR | Status: AC
  Administered 2012-12-10: 20 mL via INTRADERMAL

## 2012-12-10 MED ORDER — OXYCODONE-ACETAMINOPHEN 5-325 MG PO TABS: 1.0000 | ORAL_TABLET | ORAL | Status: DC | PRN

## 2012-12-10 MED ORDER — SULFAMETHOXAZOLE-TRIMETHOPRIM 800-160 MG PO TABS: 1.0000 | ORAL_TABLET | Freq: Two times a day (BID) | ORAL | Status: DC

## 2012-12-10 MED ORDER — LIDOCAINE-EPINEPHRINE 2 %-1:100000 IJ SOLN
INTRAMUSCULAR | Status: AC
  Filled 2012-12-10: qty 1

## 2012-12-10 NOTE — ED Notes (Signed)
Patient states she developed one abscess on her right upper thigh, which has now spread to the pubic area.  Abscess on thigh is open and draining a purulent drainage.  Area is red and warm to touch.

## 2012-12-10 NOTE — ED Provider Notes (Signed)
History     CSN: [id number]  Arrival date & time 12/10/12  4098   First MD Initiated Contact with Patient 12/10/12 [phone]      Chief Complaint  Patient presents with  . Abscess    (Consider location/radiation/quality/duration/timing/severity/associated sxs/prior treatment) HPI Patient with pain and swelling to right groin after shaving 3 days ago. She has noticed some drainage in the right groin. She's had some increased swelling in the suprapubic area. She e has not noted any fever  she has not had any similar events in the past. Is not been on any recent antibiotics. She has not had any trauma to this area besides shaving. History reviewed. No pertinent past medical history.  Past Surgical History  Procedure Date  . Fracture surgery   . Cesarean section     No family history on file.  History  Substance Use Topics  . Smoking status: Never Smoker   . Smokeless tobacco: Not on file  . Alcohol Use: No    OB History    Grav Para Term Preterm Abortions TAB SAB Ect Mult Living                  Review of Systems  All other systems reviewed and are negative.    Allergies  Codeine  Home Medications   Current Outpatient Rx  Name  Route  Sig  Dispense  Refill  . CIPROFLOXACIN HCL 500 MG PO TABS   Oral   Take 1 tablet (500 mg total) by mouth 2 (two) times daily.   14 tablet   0   . HYDROCODONE-ACETAMINOPHEN 7.5-325 MG/15ML PO SOLN   Oral   Take 10 mLs by mouth 4 (four) times daily as needed for pain.   120 mL   0   . HYDROCODONE-ACETAMINOPHEN 5-325 MG PO TABS   Oral   Take 2 tablets by mouth every 4 (four) hours as needed for pain.   6 tablet   0   . IBUPROFEN 200 MG PO TABS   Oral   Take 800 mg by mouth every 6 (six) hours as needed.         Marland Kitchen PROCHLORPERAZINE MALEATE 10 MG PO TABS   Oral   Take 1 tablet (10 mg total) by mouth 2 (two) times daily as needed. For migraine headache - take with 25mg  benadryl   10 tablet   1     BP 137/82  Pulse 104   Temp 99.3 F (37.4 C) (Oral)  Resp 20  Ht 4\' 11"  (1.499 m)  Wt 170 lb (77.111 kg)  BMI 34.34 kg/m2  SpO2 99%  LMP 12/09/2012  Physical Exam  Nursing note and vitals reviewed. Constitutional: She is oriented to person, place, and time. She appears well-developed and well-nourished.       Obese  HENT:  Head: Normocephalic and atraumatic.  Neck: Normal range of motion. Neck supple.  Cardiovascular: Normal rate.   Pulmonary/Chest: Effort normal.  Musculoskeletal: Normal range of motion.  Neurological: She is alert and oriented to person, place, and time.  Skin:       Tender erythematous fluctuant area right groin extending 4 cm and medially to the suprapubic area. There is drainage on the lateral aspect.  Psychiatric: She has a normal mood and affect. Her behavior is normal. Judgment and thought content normal.    ED Course  INCISION AND DRAINAGE Date/Time: 12/10/2012 9:31 AM Performed by: Hilario Quarry Authorized by: Hilario Quarry Consent: Verbal consent  obtained. Risks and benefits: risks, benefits and alternatives were discussed Consent given by: patient Patient understanding: patient states understanding of the procedure being performed Patient consent: the patient's understanding of the procedure matches consent given Patient identity confirmed: verbally with patient and arm band Time out: Immediately prior to procedure a "time out" was called to verify the correct patient, procedure, equipment, support staff and site/side marked as required. Type: abscess Body area: lower extremity Anesthesia: local infiltration Local anesthetic: lidocaine 1% with epinephrine Anesthetic total: 4 ml Patient sedated: no Risk factor: underlying major vessel and underlying major nerve Scalpel size: 11 Incision type: single straight Complexity: simple Drainage: purulent Drainage amount: moderate Wound treatment: wound left open Patient tolerance: Patient tolerated the procedure well  with no immediate complications.   (including critical care time)  Labs Reviewed - No data to display No results found.   No diagnosis found.    MDM  Patient seem to have significant cellulitis surrounding area. She we placed on Bactrim DS. She is instructed to call with her primary care doctor in 2 days. She's instructed regarding signs and symptoms to return to the emergency department for such as increasing pain, swelling, or fever. She voices understanding. She is placed on Bactrim. She is breast-feeding the child is 16 months old. 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

## 2013-07-11 ENCOUNTER — Emergency Department (HOSPITAL_BASED_OUTPATIENT_CLINIC_OR_DEPARTMENT_OTHER)
Admission: EM | Admit: 2013-07-11 | Discharge: 2013-07-11 | Disposition: A | Discharge status: Home / Self Care | Payer: Medicaid Other | Attending: Emergency Medicine | Admitting: Emergency Medicine

## 2013-07-11 ENCOUNTER — Encounter (HOSPITAL_BASED_OUTPATIENT_CLINIC_OR_DEPARTMENT_OTHER): Payer: Self-pay | Admitting: Emergency Medicine

## 2013-07-11 DIAGNOSIS — R509 Fever, unspecified: Secondary | ICD-10-CM | POA: Insufficient documentation

## 2013-07-11 DIAGNOSIS — R059 Cough, unspecified: Secondary | ICD-10-CM | POA: Insufficient documentation

## 2013-07-11 DIAGNOSIS — R5381 Other malaise: Secondary | ICD-10-CM | POA: Insufficient documentation

## 2013-07-11 DIAGNOSIS — R5383 Other fatigue: Secondary | ICD-10-CM | POA: Insufficient documentation

## 2013-07-11 DIAGNOSIS — R52 Pain, unspecified: Secondary | ICD-10-CM | POA: Insufficient documentation

## 2013-07-11 DIAGNOSIS — R05 Cough: Secondary | ICD-10-CM | POA: Insufficient documentation

## 2013-07-11 DIAGNOSIS — L299 Pruritus, unspecified: Secondary | ICD-10-CM | POA: Insufficient documentation

## 2013-07-11 DIAGNOSIS — Z79899 Other long term (current) drug therapy: Secondary | ICD-10-CM | POA: Insufficient documentation

## 2013-07-11 DIAGNOSIS — J029 Acute pharyngitis, unspecified: Secondary | ICD-10-CM | POA: Insufficient documentation

## 2013-07-11 DIAGNOSIS — R197 Diarrhea, unspecified: Secondary | ICD-10-CM | POA: Insufficient documentation

## 2013-07-11 DIAGNOSIS — J069 Acute upper respiratory infection, unspecified: Secondary | ICD-10-CM | POA: Insufficient documentation

## 2013-07-11 MED ORDER — ALBUTEROL SULFATE HFA 108 (90 BASE) MCG/ACT IN AERS: 2.0000 | INHALATION_SPRAY | RESPIRATORY_TRACT | Status: DC | PRN

## 2013-07-11 MED ORDER — OXYMETAZOLINE HCL 0.05 % NA SOLN
2.0000 | Freq: Two times a day (BID) | NASAL | Status: DC | PRN
  Administered 2013-07-11: 2 via NASAL
  Filled 2013-07-11: qty 15

## 2013-07-11 NOTE — ED Notes (Signed)
Pt c/o cough, sinus congestion, sore throat onset today.

## 2013-07-11 NOTE — ED Provider Notes (Signed)
CSN: [id number]     Arrival date & time 07/11/13  0016 History     First MD Initiated Contact with Patient 07/11/13 0038     Chief Complaint  Patient presents with  . URI   (Consider location/radiation/quality/duration/timing/severity/associated sxs/prior Treatment) HPI This is a 22 year old female with a one-day history of upper respiratory infection symptoms. Specifically she complains primarily of nasal congestion and rhinorrhea. These are accompanied by general malaise, body aches, sore throat, itchy ears, cough and some diarrhea. She is not aware of having a fever. She denies nausea or vomiting. She has used over-the-counter Sudafed PE without relief.  History reviewed. No pertinent past medical history. Past Surgical History  Procedure Laterality Date  . Fracture surgery    . Cesarean section     No family history on file. History  Substance Use Topics  . Smoking status: Never Smoker   . Smokeless tobacco: Not on file  . Alcohol Use: No   OB History   Grav Para Term Preterm Abortions TAB SAB Ect Mult Living                 Review of Systems  All other systems reviewed and are negative.    Allergies  Codeine  Home Medications   Current Outpatient Rx  Name  Route  Sig  Dispense  Refill  . Phenylephrine-DM-GG-APAP (SUDAFED PE COLD/COUGH PO)   Oral   Take by mouth.         . ciprofloxacin (CIPRO) 500 MG tablet   Oral   Take 1 tablet (500 mg total) by mouth 2 (two) times daily.   14 tablet   0   . hydrocodone-acetaminophen (HYCET) 7.5-325 MG/15ML solution   Oral   Take 10 mLs by mouth 4 (four) times daily as needed for pain.   120 mL   0   . HYDROcodone-acetaminophen (NORCO/VICODIN) 5-325 MG per tablet   Oral   Take 2 tablets by mouth every 4 (four) hours as needed for pain.   6 tablet   0   . ibuprofen (ADVIL,MOTRIN) 200 MG tablet   Oral   Take 800 mg by mouth every 6 (six) hours as needed.         Marland Kitchen oxyCODONE-acetaminophen (PERCOCET/ROXICET)  5-325 MG per tablet   Oral   Take 1 tablet by mouth every 4 (four) hours as needed for pain.   12 tablet   0   . prochlorperazine (COMPAZINE) 10 MG tablet   Oral   Take 1 tablet (10 mg total) by mouth 2 (two) times daily as needed. For migraine headache - take with 25mg  benadryl   10 tablet   1   . sulfamethoxazole-trimethoprim (SEPTRA DS) 800-160 MG per tablet   Oral   Take 1 tablet by mouth every 12 (twelve) hours.   42 tablet   0    BP 145/85  Pulse 92  Temp(Src) 98.6 F (37 C) (Oral)  Resp 18  Ht 4\' 11"  (1.499 m)  Wt 170 lb (77.111 kg)  BMI 34.32 kg/m2  SpO2 100%  Physical Exam General: Well-developed, well-nourished female in no acute distress; appearance consistent with age of record HENT: normocephalic, atraumatic; nasal congestion; no pharyngeal erythema or exudates; TMs normal Eyes: pupils equal round and reactive to light; extraocular muscles intact Neck: supple; no lymphadenopathy Heart: regular rate and rhythm Lungs: Decreased air movement without frank Abdomen: soft; nondistended; nontender Extremities: No deformity; full range of motion Neurologic: Awake, alert and oriented; motor function intact  in all extremities and symmetric; no facial droop Skin: Warm and dry Psychiatric: Normal mood and affect    ED Course   Procedures (including critical care time)    MDM    Hanley Seamen, MD 07/11/13 [phone]

## 2014-03-17 ENCOUNTER — Emergency Department (HOSPITAL_BASED_OUTPATIENT_CLINIC_OR_DEPARTMENT_OTHER): Payer: Medicaid Other

## 2014-03-17 ENCOUNTER — Encounter (HOSPITAL_BASED_OUTPATIENT_CLINIC_OR_DEPARTMENT_OTHER): Payer: Self-pay | Admitting: Emergency Medicine

## 2014-03-17 ENCOUNTER — Emergency Department (HOSPITAL_BASED_OUTPATIENT_CLINIC_OR_DEPARTMENT_OTHER)
Admission: EM | Admit: 2014-03-17 | Discharge: 2014-03-17 | Disposition: A | Discharge status: Home / Self Care | Payer: Medicaid Other | Attending: Emergency Medicine | Admitting: Emergency Medicine

## 2014-03-17 DIAGNOSIS — B349 Viral infection, unspecified: Secondary | ICD-10-CM

## 2014-03-17 DIAGNOSIS — Z79899 Other long term (current) drug therapy: Secondary | ICD-10-CM | POA: Insufficient documentation

## 2014-03-17 DIAGNOSIS — B9789 Other viral agents as the cause of diseases classified elsewhere: Secondary | ICD-10-CM | POA: Insufficient documentation

## 2014-03-17 MED ORDER — PROMETHAZINE HCL 12.5 MG RE SUPP: 12.5000 mg | Freq: Four times a day (QID) | RECTAL | Status: DC | PRN

## 2014-03-17 MED ORDER — FLUTICASONE PROPIONATE 50 MCG/ACT NA SUSP: 2.0000 | Freq: Every day | NASAL | Status: DC

## 2014-03-17 MED ORDER — ONDANSETRON 8 MG PO TBDP
8.0000 mg | ORAL_TABLET | Freq: Once | ORAL | Status: AC
  Administered 2014-03-17: 8 mg via ORAL
  Filled 2014-03-17: qty 1

## 2014-03-17 MED ORDER — ACETAMINOPHEN 500 MG PO TABS
1000.0000 mg | ORAL_TABLET | Freq: Once | ORAL | Status: AC
  Administered 2014-03-17: 1000 mg via ORAL
  Filled 2014-03-17: qty 2

## 2014-03-17 MED ORDER — IBUPROFEN 800 MG PO TABS
800.0000 mg | ORAL_TABLET | Freq: Once | ORAL | Status: AC
  Administered 2014-03-17: 800 mg via ORAL
  Filled 2014-03-17: qty 1

## 2014-03-17 MED ORDER — IBUPROFEN 800 MG PO TABS: 800.0000 mg | ORAL_TABLET | Freq: Three times a day (TID) | ORAL | Status: DC

## 2014-03-17 NOTE — ED Notes (Addendum)
Body aches, congestion, cough x 1 week w some n/v

## 2014-03-17 NOTE — ED Notes (Signed)
Pt c/o congestion, chills, productive cough with green sputum since yesterday, c/o rt flank pain and vomiting since this am

## 2014-03-17 NOTE — ED Provider Notes (Signed)
CSN: [id number]     Arrival date & time 03/17/14  0505 History   None    Chief Complaint  Patient presents with  . Nasal Congestion  . Emesis  . Chills     (Consider location/radiation/quality/duration/timing/severity/associated sxs/prior Treatment) Patient is a 23 y.o. female presenting with URI. The history is provided by the patient. No language interpreter was used.  URI Presenting symptoms: congestion and cough   Presenting symptoms: no fever and no sore throat   Congestion:    Location:  Nasal   Interferes with sleep: no     Interferes with eating/drinking: no   Severity:  Moderate Onset quality:  Sudden Duration:  1 day Timing:  Constant Progression:  Unchanged Chronicity:  New Relieved by:  Nothing Worsened by:  Nothing tried Ineffective treatments:  None tried Associated symptoms: myalgias   Associated symptoms: no neck pain, no swollen glands and no wheezing   Associated symptoms comment:  Low back pain post emesis Myalgias:    Location:  Back   Quality:  Aching   Severity:  Moderate   Onset quality:  Sudden   Timing:  Constant   Progression:  Unchanged Risk factors: sick contacts   Risk factors: not elderly, no recent illness and no recent travel     History reviewed. No pertinent past medical history. Past Surgical History  Procedure Laterality Date  . Fracture surgery    . Cesarean section     No family history on file. History  Substance Use Topics  . Smoking status: Never Smoker   . Smokeless tobacco: Not on file  . Alcohol Use: No   OB History   Grav Para Term Preterm Abortions TAB SAB Ect Mult Living                 Review of Systems  Constitutional: Negative for fever.  HENT: Positive for congestion. Negative for sore throat.   Eyes: Negative for photophobia.  Respiratory: Positive for cough. Negative for shortness of breath and wheezing.   Cardiovascular: Negative for chest pain, palpitations and leg swelling.  Musculoskeletal:  Positive for myalgias. Negative for neck pain.  All other systems reviewed and are negative.     Allergies  Codeine  Home Medications   Current Outpatient Rx  Name  Route  Sig  Dispense  Refill  . albuterol (PROVENTIL HFA;VENTOLIN HFA) 108 (90 BASE) MCG/ACT inhaler   Inhalation   Inhale 2 puffs into the lungs every 4 (four) hours as needed (cough, wheezing or shortness of breath).   1 Inhaler   0   . ciprofloxacin (CIPRO) 500 MG tablet   Oral   Take 1 tablet (500 mg total) by mouth 2 (two) times daily.   14 tablet   0   . hydrocodone-acetaminophen (HYCET) 7.5-325 MG/15ML solution   Oral   Take 10 mLs by mouth 4 (four) times daily as needed for pain.   120 mL   0   . HYDROcodone-acetaminophen (NORCO/VICODIN) 5-325 MG per tablet   Oral   Take 2 tablets by mouth every 4 (four) hours as needed for pain.   6 tablet   0   . ibuprofen (ADVIL,MOTRIN) 200 MG tablet   Oral   Take 800 mg by mouth every 6 (six) hours as needed.         Marland Kitchen oxyCODONE-acetaminophen (PERCOCET/ROXICET) 5-325 MG per tablet   Oral   Take 1 tablet by mouth every 4 (four) hours as needed for pain.   12  tablet   0   . Phenylephrine-DM-GG-APAP (SUDAFED PE COLD/COUGH PO)   Oral   Take by mouth.         . prochlorperazine (COMPAZINE) 10 MG tablet   Oral   Take 1 tablet (10 mg total) by mouth 2 (two) times daily as needed. For migraine headache - take with 25mg  benadryl   10 tablet   1   . sulfamethoxazole-trimethoprim (SEPTRA DS) 800-160 MG per tablet   Oral   Take 1 tablet by mouth every 12 (twelve) hours.   42 tablet   0    BP 131/83  Pulse 102  Temp(Src) 99.4 F (37.4 C) (Oral)  Resp 20  SpO2 99%  LMP 03/16/2014 Physical Exam  Constitutional: She is oriented to person, place, and time. She appears well-developed and well-nourished. No distress.  HENT:  Head: Normocephalic and atraumatic.  Mouth/Throat: Oropharynx is clear and moist. No oropharyngeal exudate.  Eyes:  Conjunctivae are normal. Pupils are equal, round, and reactive to light.  Neck: Normal range of motion. Neck supple.  Cardiovascular: Normal rate, regular rhythm and intact distal pulses.   Pulmonary/Chest: Effort normal and breath sounds normal. No stridor. No respiratory distress. She has no wheezes. She has no rales.  Abdominal: Soft. Bowel sounds are normal. There is no tenderness. There is no rebound and no guarding.  Musculoskeletal: Normal range of motion.  Neurological: She is alert and oriented to person, place, and time.  Skin: Skin is warm and dry.  Psychiatric: She has a normal mood and affect.    ED Course  Procedures (including critical care time) Labs Review Labs Reviewed - No data to display Imaging Review No results found.   EKG Interpretation None      MDM   Final diagnoses:  None  viral syndrome  Wells score 0.  Other family members present with same symptoms.  Will prescribe antiemetics and ibuprofen    Taronda Comacho K Sirron Francesconi-Rasch, MD 03/17/14 0530

## 2014-03-18 ENCOUNTER — Encounter (HOSPITAL_BASED_OUTPATIENT_CLINIC_OR_DEPARTMENT_OTHER): Payer: Self-pay | Admitting: Emergency Medicine

## 2014-03-18 ENCOUNTER — Emergency Department (HOSPITAL_BASED_OUTPATIENT_CLINIC_OR_DEPARTMENT_OTHER)
Admission: EM | Admit: 2014-03-18 | Discharge: 2014-03-18 | Disposition: A | Discharge status: Home / Self Care | Payer: Medicaid Other | Attending: Emergency Medicine | Admitting: Emergency Medicine

## 2014-03-18 DIAGNOSIS — Z792 Long term (current) use of antibiotics: Secondary | ICD-10-CM | POA: Insufficient documentation

## 2014-03-18 DIAGNOSIS — Z791 Long term (current) use of non-steroidal anti-inflammatories (NSAID): Secondary | ICD-10-CM | POA: Insufficient documentation

## 2014-03-18 DIAGNOSIS — G43909 Migraine, unspecified, not intractable, without status migrainosus: Secondary | ICD-10-CM | POA: Insufficient documentation

## 2014-03-18 DIAGNOSIS — IMO0002 Reserved for concepts with insufficient information to code with codable children: Secondary | ICD-10-CM | POA: Insufficient documentation

## 2014-03-18 DIAGNOSIS — Z79899 Other long term (current) drug therapy: Secondary | ICD-10-CM | POA: Insufficient documentation

## 2014-03-18 MED ORDER — METOCLOPRAMIDE HCL 5 MG/ML IJ SOLN
10.0000 mg | Freq: Once | INTRAMUSCULAR | Status: AC
  Administered 2014-03-18: 10 mg via INTRAVENOUS
  Filled 2014-03-18: qty 2

## 2014-03-18 MED ORDER — DIPHENHYDRAMINE HCL 50 MG/ML IJ SOLN
25.0000 mg | Freq: Once | INTRAMUSCULAR | Status: AC
  Administered 2014-03-18: 25 mg via INTRAVENOUS
  Filled 2014-03-18: qty 1

## 2014-03-18 MED ORDER — DEXAMETHASONE SODIUM PHOSPHATE 10 MG/ML IJ SOLN
10.0000 mg | Freq: Once | INTRAMUSCULAR | Status: AC
  Administered 2014-03-18: 10 mg via INTRAVENOUS
  Filled 2014-03-18: qty 1

## 2014-03-18 NOTE — ED Provider Notes (Signed)
CSN: [id number]     Arrival date & time 03/18/14  1310 History   First MD Initiated Contact with Patient 03/18/14 1502     Chief Complaint  Patient presents with  . Headache     (Consider location/radiation/quality/duration/timing/severity/associated sxs/prior Treatment) Patient is a 23 y.o. female presenting with headaches. The history is provided by the patient.  Headache Pain location:  Generalized Quality:  Dull Radiates to:  Does not radiate Onset quality:  Gradual Duration:  3 days Timing:  Constant Progression:  Worsening Chronicity:  Recurrent Similar to prior headaches: yes   Context comment:  URI Relieved by:  Nothing Worsened by:  Nothing tried Ineffective treatments:  None tried Associated symptoms: nausea and vomiting   Associated symptoms: no abdominal pain, no cough and no fever     History reviewed. No pertinent past medical history. Past Surgical History  Procedure Laterality Date  . Fracture surgery    . Cesarean section     No family history on file. History  Substance Use Topics  . Smoking status: Never Smoker   . Smokeless tobacco: Not on file  . Alcohol Use: No   OB History   Grav Para Term Preterm Abortions TAB SAB Ect Mult Living                 Review of Systems  Constitutional: Negative for fever and chills.  Respiratory: Negative for cough and shortness of breath.   Gastrointestinal: Positive for nausea and vomiting. Negative for abdominal pain.  Neurological: Positive for headaches.  All other systems reviewed and are negative.     Allergies  Codeine  Home Medications   Current Outpatient Rx  Name  Route  Sig  Dispense  Refill  . albuterol (PROVENTIL HFA;VENTOLIN HFA) 108 (90 BASE) MCG/ACT inhaler   Inhalation   Inhale 2 puffs into the lungs every 4 (four) hours as needed (cough, wheezing or shortness of breath).   1 Inhaler   0   . ciprofloxacin (CIPRO) 500 MG tablet   Oral   Take 1 tablet (500 mg total) by mouth 2  (two) times daily.   14 tablet   0   . fluticasone (FLONASE) 50 MCG/ACT nasal spray   Each Nare   Place 2 sprays into both nostrils daily.   16 g   0   . hydrocodone-acetaminophen (HYCET) 7.5-325 MG/15ML solution   Oral   Take 10 mLs by mouth 4 (four) times daily as needed for pain.   120 mL   0   . HYDROcodone-acetaminophen (NORCO/VICODIN) 5-325 MG per tablet   Oral   Take 2 tablets by mouth every 4 (four) hours as needed for pain.   6 tablet   0   . ibuprofen (ADVIL,MOTRIN) 200 MG tablet   Oral   Take 800 mg by mouth every 6 (six) hours as needed.         Marland Kitchen. ibuprofen (ADVIL,MOTRIN) 800 MG tablet   Oral   Take 1 tablet (800 mg total) by mouth 3 (three) times daily.   21 tablet   0   . oxyCODONE-acetaminophen (PERCOCET/ROXICET) 5-325 MG per tablet   Oral   Take 1 tablet by mouth every 4 (four) hours as needed for pain.   12 tablet   0   . Phenylephrine-DM-GG-APAP (SUDAFED PE COLD/COUGH PO)   Oral   Take by mouth.         . prochlorperazine (COMPAZINE) 10 MG tablet   Oral   Take 1  tablet (10 mg total) by mouth 2 (two) times daily as needed. For migraine headache - take with 25mg  benadryl   10 tablet   1   . promethazine (PHENERGAN) 12.5 MG suppository   Rectal   Place 1 suppository (12.5 mg total) rectally every 6 (six) hours as needed for nausea or vomiting.   12 each   0   . sulfamethoxazole-trimethoprim (SEPTRA DS) 800-160 MG per tablet   Oral   Take 1 tablet by mouth every 12 (twelve) hours.   42 tablet   0    BP 130/87  Pulse 115  Temp(Src) 99.4 F (37.4 C) (Oral)  Resp 14  Ht 4\' 11"  (1.499 m)  Wt 170 lb (77.111 kg)  BMI 34.32 kg/m2  SpO2 99%  LMP 03/16/2014 Physical Exam  Nursing note and vitals reviewed. Constitutional: She is oriented to person, place, and time. She appears well-developed and well-nourished. No distress.  HENT:  Head: Normocephalic and atraumatic.  Eyes: EOM are normal. Pupils are equal, round, and reactive to  light.  Neck: Normal range of motion. Neck supple.  Cardiovascular: Normal rate and regular rhythm.  Exam reveals no friction rub.   No murmur heard. Pulmonary/Chest: Effort normal and breath sounds normal. No respiratory distress. She has no wheezes. She has no rales.  Abdominal: Soft. She exhibits no distension. There is no tenderness. There is no rebound.  Musculoskeletal: Normal range of motion. She exhibits no edema.  Neurological: She is alert and oriented to person, place, and time.  Skin: No rash noted. She is not diaphoretic.    ED Course  Procedures (including critical care time) Labs Review Labs Reviewed - No data to display Imaging Review Dg Chest 2 View  03/17/2014   CLINICAL DATA:  NASAL CONGESTION EMESIS CHILLS NASAL CONGESTION EMESIS CHILLS  EXAM: CHEST  2 VIEW  COMPARISON:  Chest radiograph report dated March 21, 2009 though images are not available for direct comparison.  FINDINGS: The heart size and mediastinal contours are within normal limits. Both lungs are clear. The visualized skeletal structures are unremarkable.  IMPRESSION: No active cardiopulmonary disease.   Electronically Signed   By: Awilda Metro   On: 03/17/2014 05:40     EKG Interpretation None      MDM   Final diagnoses:  Migraine headache    29F here with headache. Worse for past 3 days. Associated phonophobia, photophobia. Associated nausea, vomiting. No fevers. Denies any vision changes, weakness, numbness. On exam is photophobic, has normal strength and sensation in all extremities. Cranial nerves intact. Will treat for migraine with Reglan, Decadron, Benadryl. Greatly improved after meds. Stable for discharge.  Dagmar Hait, MD 03/18/14 2325

## 2014-03-18 NOTE — ED Notes (Signed)
Headache. States she was seen here last night and pain is no better. She is vomiting the medication given.

## 2014-03-18 NOTE — Discharge Instructions (Signed)
Migraine Headache °A migraine headache is an intense, throbbing pain on one or both sides of your head. A migraine can last for 30 minutes to several hours. °CAUSES  °The exact cause of a migraine headache is not always known. However, a migraine may be caused when nerves in the brain become irritated and release chemicals that cause inflammation. This causes pain. °Certain things may also trigger migraines, such as: °· Alcohol. °· Smoking. °· Stress. °· Menstruation. °· Aged cheeses. °· Foods or drinks that contain nitrates, glutamate, aspartame, or tyramine. °· Lack of sleep. °· Chocolate. °· Caffeine. °· Hunger. °· Physical exertion. °· Fatigue. °· Medicines used to treat chest pain (nitroglycerine), birth control pills, estrogen, and some blood pressure medicines. °SIGNS AND SYMPTOMS °· Pain on one or both sides of your head. °· Pulsating or throbbing pain. °· Severe pain that prevents daily activities. °· Pain that is aggravated by any physical activity. °· Nausea, vomiting, or both. °· Dizziness. °· Pain with exposure to bright lights, loud noises, or activity. °· General sensitivity to bright lights, loud noises, or smells. °Before you get a migraine, you may get warning signs that a migraine is coming (aura). An aura may include: °· Seeing flashing lights. °· Seeing bright spots, halos, or zig-zag lines. °· Having tunnel vision or blurred vision. °· Having feelings of numbness or tingling. °· Having trouble talking. °· Having muscle weakness. °DIAGNOSIS  °A migraine headache is often diagnosed based on: °· Symptoms. °· Physical exam. °· A CT scan or MRI of your head. These imaging tests cannot diagnose migraines, but they can help rule out other causes of headaches. °TREATMENT °Medicines may be given for pain and nausea. Medicines can also be given to help prevent recurrent migraines.  °HOME CARE INSTRUCTIONS °· Only take over-the-counter or prescription medicines for pain or discomfort as directed by your  health care provider. The use of long-term narcotics is not recommended. °· Lie down in a dark, quiet room when you have a migraine. °· Keep a journal to find out what may trigger your migraine headaches. For example, write down: °· What you eat and drink. °· How much sleep you get. °· Any change to your diet or medicines. °· Limit alcohol consumption. °· Quit smoking if you smoke. °· Get 7 9 hours of sleep, or as recommended by your health care provider. °· Limit stress. °· Keep lights dim if bright lights bother you and make your migraines worse. °SEEK IMMEDIATE MEDICAL CARE IF:  °· Your migraine becomes severe. °· You have a fever. °· You have a stiff neck. °· You have vision loss. °· You have muscular weakness or loss of muscle control. °· You start losing your balance or have trouble walking. °· You feel faint or pass out. °· You have severe symptoms that are different from your first symptoms. °MAKE SURE YOU:  °· Understand these instructions. °· Will watch your condition. °· Will get help right away if you are not doing well or get worse. °Document Released: 11/26/2005 Document Revised: 09/16/2013 Document Reviewed: 08/03/2013 °ExitCare® Patient Information ©2014 [employer]. ° ° °Emergency Department Resource Guide °1) Find a Doctor and Pay Out of Pocket °Although you won't have to find out who is covered by your insurance plan, it is a good idea to ask around and get recommendations. You will then need to call the office and see if the doctor you have chosen will accept you as a new patient and what types of options   they offer for patients who are self-pay. Some doctors offer discounts or will set up payment plans for their patients who do not have insurance, but you will need to ask so you aren't surprised when you get to your appointment. ° °2) Contact Your Local Health Department °Not all health departments have doctors that can see patients for sick visits, but many do, so it is worth a call to see if  yours does. If you don't know where your local health department is, you can check in your phone book. The CDC also has a tool to help you locate your state's health department, and many state websites also have listings of all of their local health departments. ° °3) Find a Walk-in Clinic °If your illness is not likely to be very severe or complicated, you may want to try a walk in clinic. These are popping up all over the country in pharmacies, drugstores, and shopping centers. They're usually staffed by nurse practitioners or physician assistants that have been trained to treat common illnesses and complaints. They're usually fairly quick and inexpensive. However, if you have serious medical issues or chronic medical problems, these are probably not your best option. ° °No Primary Care Doctor: °- Call [facility] at  832-8000 - they can help you locate a primary care doctor that  accepts your insurance, provides certain services, etc. °- Physician Referral Service- 1-800-533-3463 ° °Chronic Pain Problems: °Organization         Address  Phone   Notes  °[facility]  (336) 297-2271 Patients need to be referred by their primary care doctor.  ° °Medication Assistance: °Organization         Address  Phone   Notes  °[place] [facility] [address] °[place], [place] [address] (336) 641-8030 --Must be a resident of [place] °-- Must have NO insurance coverage whatsoever (no Medicaid/ Medicare, etc.) °-- The pt. MUST have a primary care doctor that directs their care regularly and follows them in the community °  °[facility]  (866) 331-1348   °[employer]  (888) 892-1162   ° °Agencies that provide inexpensive medical care: °Organization         Address  Phone   Notes  °[facility]  (336) 832-8035   °[facility]    (336) 832-7272   °[facility] [address] °[place], [place] [address] (336) 832-4777    °[facility] [address], °[place] (336) 271-4999   °[facility]    (336) 373-0678   °[facility]    (336) 272-1050   °[facility] ° [address], [place] Phone:  (336) 832-4444, Fax:  (336) 832-4440 Hours of Operation:  9 am - 6 pm, M-F.  Also accepts Medicaid/Medicare and self-pay.  °[facility] ° [address], [place] Phone: (336) 832-3150, Fax: (336) [phone]. Hours of Operation:  8:30 am - 5:30 pm, M-F.  Also accepts Medicaid and self-pay.  °[facility] [place] [address], [place] Phone: (336) 878-6027   °[facility] [address], [place],  (336)723-1848, Ext. 123 Mondays & Thursdays: 7-9 AM.  First 15 patients are seen on a first come, first serve basis. °  ° °Medicaid-accepting [place] Providers: ° °Organization         Address  Phone   Notes  °[facility] [address], [place] (336) 641-2100 Also accepts self-pay patients.  °[facility] [address], [place] ° (336) 856-9996   °[facility] [address], [place] (336) 288-8857   °[facility] [address] [place] [address], [place] (336) 299-7000   °Veita Bland [address], [place]  ° (336) 373-1557 Only accepts [place] Access Medicaid patients after they have their name applied to their card.  ° °Self-Pay (no insurance) in [place]: ° °Organization         Address  Phone   Notes  °Sickle Cell Patients, [facility] [address], [place] (336) 832-1970   °[facility] [address], [place] (336) 832-4400   °[facility] [place] ° [address], [place] (336) 992-4800   °[facility]/Dr. Osei-Bonsu ° [address] [place] [address], [place] or [address], [place] (336)  [phone] Phone number for both [place] and [place] locations is the same.  °[facility] [address], [place] (336) 299-0000   °[facility] [address] [place] [address], [place] or [address] (336) 852-7530 °(336) 878-2260   °[facility] [address], [place] (336) 350-1642, phone; (336) 294-5005, fax Sees patients 1st and 3rd Saturday of every month.  Must not qualify for public or private insurance (i.e. Medicaid, Medicare, [place] Health Choice, Veterans' Benefits) • Household income should be no more than 200% of the poverty level •The clinic cannot treat you if you are pregnant or think you are pregnant • Sexually transmitted diseases are not treated at the clinic.  ° ° °Dental Care: °Organization         Address  Phone  Notes  °[facility] [facility] [address], [place] (336) 641-6152 Accepts children up to age 21 who are enrolled in Medicaid or [place] Health Choice; pregnant women with a Medicaid card; and children who have applied for Medicaid or [place] Health Choice, but were declined, whose parents can pay a reduced fee at time of service.  °[facility] [place]  [address], [place] (336) 641-7733 Accepts children up to age 21 who are enrolled in Medicaid or [place] Health Choice; pregnant women with a Medicaid card; and children who have applied for Medicaid or [place] Health Choice, but were declined, whose parents can pay a reduced fee at time of service.  °[facility] ° [address], [place] (336) 641-4533 Patients are seen by appointment only. Walk-ins are not accepted. [facility] will see patients 18 years of age and older. °Monday - Tuesday (8am-5pm) °Most Wednesdays (8:30-5pm) °$30 per visit, cash only  °[facility] ° [address], [place] (336) 641-4533 Patients are seen by  appointment only. Walk-ins are not accepted. [facility] will see patients 18 years of age and older. °One Wednesday Evening (Monthly: Volunteer Based).  $30 per visit, cash only  °[facility]  (919) 537-3737 for adults; Children under age 4, call Graduate Pediatric Dentistry at ([phone]) [phone]. Children aged 4-14, please call (919) [phone] to request a pediatric application. ° Dental services are provided in all areas of dental care including fillings, crowns and bridges, complete and partial dentures,   implants, gum treatment, root canals, and extractions. Preventive care is also provided. Treatment is provided to both adults and children. °Patients are selected via a lottery and there is often a waiting list. °  °[facility] [address], °[place] ° (336) 763-8833 [email or web address] °  °[facility] [address], [place], [place] (336)723-1848, Ext. 123 Second and Fourth Thursday of each month, opens at 6:30 AM; Clinic ends at 9 AM.  Patients are seen on a first-come first-served basis, and a limited number are seen during each clinic.  ° °[facility] ° [address], [place], [place] (336) 723-7904   Eligibility Requirements °You must have lived in [place], [place], or [place] counties for at least the last three months. °  You cannot be eligible for state or federal sponsored healthcare insurance, including Veterans Administration, Medicaid, or Medicare. °  You generally cannot be eligible for healthcare insurance through your employer.  °  How to apply: °Eligibility screenings are held every Tuesday and Wednesday afternoon from 1:00 pm until 4:00 pm. You do not need an appointment for the interview!  °[facility] [address], [place], [place] 336-631-2330   °[facility]  336-342-8273   °[facility]  336-703-3100   °[facility]  336-570-6415    ° °Behavioral Health Resources in the Community: °Intensive Outpatient Programs °Organization         Address  Phone  Notes  °[facility] [address], [place], [place] 336-878-6098   °[facility] [address], [place], [place] 336-832-9800   °[facility] [address], [place], [place] ° 336-882-2125   °[facility] [address],  °[place], [place] 1-800-853-5163 or 336-641-4981   °Substance Abuse Resources °Organization         Address  Phone  Notes  °[facility]  336-882-2125   °[facility]  336-784-9470   °[facility]  336-285-9073   °[facility]  336-845-3988   °Residential & Outpatient Substance Abuse Program  1-800-659-3381   °Psychological Services °Organization         Address  Phone  Notes  °[facility]  336- 832-9600   °[facility]  336- 378-7881   °[facility] [address], [place] 1-800-853-5163 or 336-641-4981   ° °Mobile Crisis Teams °Organization         Address  Phone  Notes  °[facility], Mobile Crisis Care Unit  1-877-626-1772   °Assertive °[facility] ° [address] [place], [place] 336-834-9664   °Sharon DeEsch [address] ° [place] 336-554-5454   ° °Self-Help/Support Groups °Organization         Address  Phone             Notes  °[facility]  - variety of support groups  336- 373-1402 Call for more information  °Narcotics Anonymous (NA), [facility] [address], °[place]   2 meetings at this location  ° °Residential Treatment Programs °Organization         Address  Phone  Notes  °[facility] [address],    °   1-866-801-8205   °[facility] ° [address], [place],  704-293-8524   °[facility] [address], [place] 336-845-3988 Admissions: 8am-3pm M-F  °Incentives    Substance [place] [address]    [place], [place] 924-268-3419   [facility] [address] [place], [place], [place]   [facility] [address]  [place], [place]   [facility] Intensive Outpatient [address], [place], [place]   [facility] ([place].,  [place], [place] 1-(601) 409-3143 or 262-327-7195   [facility] [address] [place], [place] Accepts Medicaid  [facility] [address],  [place] 1-(671)521-5825 Substance Abuse/Addiction Treatment   [facility] Organization         Address  Phone  Notes  [facility]  (229)254-1853   Domenic Schwab, PhD [address] [place], [place]   (920) 028-3814 or 7343549582   [facility] [place], [place] 512-431-6397   [facility] [address], [place], [place] 747-544-3384 Insurance/Medicaid/sponsorship through [facility] [address]                                    [place], [place] [phone] [place] [address], [place] [phone]    Dr. Adele Schilder  [phone]   [facility] Dept. 1) [address], [place] 2) [place] 3)  [address], [place] [phone]  [phone]   [place] (623) [phone] or [phone] (After Hours)

## 2015-08-30 ENCOUNTER — Encounter (HOSPITAL_BASED_OUTPATIENT_CLINIC_OR_DEPARTMENT_OTHER): Payer: Self-pay | Admitting: Emergency Medicine

## 2015-08-30 ENCOUNTER — Emergency Department (HOSPITAL_BASED_OUTPATIENT_CLINIC_OR_DEPARTMENT_OTHER)
Admission: EM | Admit: 2015-08-30 | Discharge: 2015-08-30 | Disposition: A | Discharge status: Home / Self Care | Payer: Medicaid Other | Attending: Emergency Medicine | Admitting: Emergency Medicine

## 2015-08-30 ENCOUNTER — Emergency Department (HOSPITAL_BASED_OUTPATIENT_CLINIC_OR_DEPARTMENT_OTHER): Payer: Medicaid Other

## 2015-08-30 DIAGNOSIS — Z79899 Other long term (current) drug therapy: Secondary | ICD-10-CM | POA: Insufficient documentation

## 2015-08-30 DIAGNOSIS — Z792 Long term (current) use of antibiotics: Secondary | ICD-10-CM | POA: Diagnosis not present

## 2015-08-30 DIAGNOSIS — M79672 Pain in left foot: Secondary | ICD-10-CM

## 2015-08-30 DIAGNOSIS — Z7951 Long term (current) use of inhaled steroids: Secondary | ICD-10-CM | POA: Diagnosis not present

## 2015-08-30 NOTE — Discharge Instructions (Signed)
Follow-up with sports medicine as needed.

## 2015-08-30 NOTE — ED Provider Notes (Signed)
CSN: [id number]     Arrival date & time 08/30/15  1007 History   First MD Initiated Contact with Patient 08/30/15 1019     Chief Complaint  Patient presents with  . Foot Pain     (Consider location/radiation/quality/duration/timing/severity/associated sxs/prior Treatment) Patient is a 24 y.o. female presenting with lower extremity pain. The history is provided by the patient.  Foot Pain This is a new problem.   patient has had pain in her left foot the last few days. Worse after standing. No trauma. 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 No numbness or weakness. No other injury. No relief with over-the-counter medications. No fevers or chills.  History reviewed. No pertinent past medical history. Past Surgical History  Procedure Laterality Date  . Fracture surgery    . Cesarean section     History reviewed. No pertinent family history. Social History  Substance Use Topics  . Smoking status: Never Smoker   . Smokeless tobacco: None  . Alcohol Use: No   OB History    No data available     Review of Systems  Constitutional: Negative for appetite change.  Musculoskeletal:       Left foot pain.  Neurological: Negative for weakness and numbness.      Allergies  Codeine  Home Medications   Prior to Admission medications   Medication Sig Start Date End Date Taking? Authorizing Provider  ibuprofen (ADVIL,MOTRIN) 200 MG tablet Take 800 mg by mouth every 6 (six) hours as needed.   Yes Historical Provider, MD  albuterol (PROVENTIL HFA;VENTOLIN HFA) 108 (90 BASE) MCG/ACT inhaler Inhale 2 puffs into the lungs every 4 (four) hours as needed (cough, wheezing or shortness of breath). 07/11/13   John Molpus, MD  ciprofloxacin (CIPRO) 500 MG tablet Take 1 tablet (500 mg total) by mouth 2 (two) times daily. 11/19/12   Rolan Bucco, MD  fluticasone (FLONASE) 50 MCG/ACT nasal spray Place 2 sprays into both nostrils daily. 03/17/14   April Palumbo, MD  hydrocodone-acetaminophen (HYCET) 7.5-325  MG/15ML solution Take 10 mLs by mouth 4 (four) times daily as needed for pain. 09/23/12   John Molpus, MD  HYDROcodone-acetaminophen (NORCO/VICODIN) 5-325 MG per tablet Take 2 tablets by mouth every 4 (four) hours as needed for pain. 11/19/12   Rolan Bucco, MD  ibuprofen (ADVIL,MOTRIN) 800 MG tablet Take 1 tablet (800 mg total) by mouth 3 (three) times daily. 03/17/14   April Palumbo, MD  oxyCODONE-acetaminophen (PERCOCET/ROXICET) 5-325 MG per tablet Take 1 tablet by mouth every 4 (four) hours as needed for pain. 12/10/12   Margarita Grizzle, MD  Phenylephrine-DM-GG-APAP (SUDAFED PE COLD/COUGH PO) Take by mouth.    Historical Provider, MD  prochlorperazine (COMPAZINE) 10 MG tablet Take 1 tablet (10 mg total) by mouth 2 (two) times daily as needed. For migraine headache - take with  benadryl 11/21/12   Jones Skene, MD  promethazine (PHENERGAN) 12.5 MG suppository Place 1 suppository (12.5 mg total) rectally every 6 (six) hours as needed for nausea or vomiting. 03/17/14   April Palumbo, MD  sulfamethoxazole-trimethoprim (SEPTRA DS) 800-160 MG per tablet Take 1 tablet by mouth every 12 (twelve) hours. 12/10/12   Margarita Grizzle, MD   BP 157/89 mmHg  Pulse 88  Temp(Src) 98.2 F (36.8 C) (Oral)  Resp 18  Ht  (1.499 m)  SpO2 100%  LMP 08/01/2015 Physical Exam  Constitutional: She appears well-developed.  Musculoskeletal: She exhibits tenderness.  Tenderness over proximal fifth metatarsal on left. No clear  Joint tenderness.  Slight swelling. No tenderness with axial loading onto the toe.  Strong pulse.  Neurological: She is alert.  Skin: Skin is warm.    ED Course  Procedures (including critical care time) Labs Review Labs Reviewed - No data to display  Imaging Review Dg Foot Complete Left  08/30/2015   CLINICAL DATA:  Left lateral foot pain for 1 week.  No injury.  EXAM: LEFT FOOT - COMPLETE 3+ VIEW  COMPARISON:  None.  FINDINGS: There is no evidence of fracture or dislocation. There is no  evidence of arthropathy or other focal bone abnormality. Soft tissues are unremarkable.  IMPRESSION: Negative.   Electronically Signed   By: Charlett Nose M.D.   On: 08/30/2015 10:57   I have personally reviewed and evaluated these images and lab results as part of my medical decision-making.   EKG Interpretation None      MDM   Final diagnoses:  Left foot pain    Patient with foot pain. Negative x-ray. Benign exam. Discharge home.    Benjiman Core, MD 08/31/15 [phone]

## 2015-08-30 NOTE — ED Notes (Signed)
Pt with pain to left lateral foot, denies injury

## 2015-10-16 ENCOUNTER — Encounter (HOSPITAL_BASED_OUTPATIENT_CLINIC_OR_DEPARTMENT_OTHER): Payer: Self-pay | Admitting: Emergency Medicine

## 2015-10-16 ENCOUNTER — Emergency Department (HOSPITAL_BASED_OUTPATIENT_CLINIC_OR_DEPARTMENT_OTHER)
Admission: EM | Admit: 2015-10-16 | Discharge: 2015-10-17 | Disposition: A | Discharge status: Home / Self Care | Payer: Medicaid Other | Attending: Emergency Medicine | Admitting: Emergency Medicine

## 2015-10-16 DIAGNOSIS — G43909 Migraine, unspecified, not intractable, without status migrainosus: Secondary | ICD-10-CM | POA: Diagnosis not present

## 2015-10-16 DIAGNOSIS — Z7951 Long term (current) use of inhaled steroids: Secondary | ICD-10-CM | POA: Diagnosis not present

## 2015-10-16 DIAGNOSIS — G43009 Migraine without aura, not intractable, without status migrainosus: Secondary | ICD-10-CM

## 2015-10-16 DIAGNOSIS — Z79899 Other long term (current) drug therapy: Secondary | ICD-10-CM | POA: Diagnosis not present

## 2015-10-16 DIAGNOSIS — Z3202 Encounter for pregnancy test, result negative: Secondary | ICD-10-CM | POA: Insufficient documentation

## 2015-10-16 DIAGNOSIS — R51 Headache: Secondary | ICD-10-CM | POA: Diagnosis present

## 2015-10-16 DIAGNOSIS — Z792 Long term (current) use of antibiotics: Secondary | ICD-10-CM | POA: Insufficient documentation

## 2015-10-16 MED ORDER — DEXAMETHASONE SODIUM PHOSPHATE 10 MG/ML IJ SOLN
10.0000 mg | Freq: Once | INTRAMUSCULAR | Status: AC
  Administered 2015-10-17: 10 mg via INTRAMUSCULAR
  Filled 2015-10-16: qty 1

## 2015-10-16 MED ORDER — ONDANSETRON 8 MG PO TBDP
8.0000 mg | ORAL_TABLET | Freq: Once | ORAL | Status: AC
  Administered 2015-10-17: 8 mg via ORAL
  Filled 2015-10-16: qty 1

## 2015-10-16 MED ORDER — METOCLOPRAMIDE HCL 5 MG/ML IJ SOLN
10.0000 mg | Freq: Once | INTRAMUSCULAR | Status: AC
Start: 2015-10-17 — End: 2015-10-17
  Administered 2015-10-17: 10 mg via INTRAMUSCULAR
  Filled 2015-10-16: qty 2

## 2015-10-16 NOTE — ED Provider Notes (Signed)
CSN: [id number]     Arrival date & time 10/16/15  2300 History  By signing my name below, I, Destiny Pugh, attest that this documentation has been prepared under the direction and in the presence of Destiny Orr, MD. Electronically Signed: Evon Slackerrance Pugh, ED Scribe. 10/16/2015. 11:25 PM.      Chief Complaint  Patient presents with  . Headache   Patient is a 24 y.o. female presenting with headaches. The history is provided by the patient. No language interpreter was used.  Headache Pain location:  L temporal Quality:  Dull Radiates to:  Does not radiate Onset quality:  Gradual Duration:  1 hour Timing:  Constant Progression:  Unchanged Chronicity:  Recurrent Similar to prior headaches: yes   Context: not activity, not loud noise and not straining   Relieved by:  Nothing Worsened by:  Nothing Ineffective treatments:  NSAIDs Associated symptoms: nausea and vomiting   Associated symptoms: no abdominal pain, no dizziness, no eye pain, no fever, no focal weakness, no neck pain, no neck stiffness, no numbness, no paresthesias, no seizures, no swollen glands, no syncope, no tingling and no weakness    HPI Comments: Destiny Pugh is a 24 y.o. female who presents to the Emergency Department complaining of migraine HA onset tonight at 10:30PM. States the migraine is located left temporal region. Pt states she has associated nausea and vomiting. Pt states she gets migraine Ha often usual about 1 once every other week and that her HA tonight feels like previous migraines. Pt states she has tried Aleve with no relief. Pt does report slight nasal congestion but relates to her season allergies.   History reviewed. No pertinent past medical history. Past Surgical History  Procedure Laterality Date  . Fracture surgery    . Cesarean section     History reviewed. No pertinent family history. Social History  Substance Use Topics  . Smoking status: Never Smoker   . Smokeless tobacco: None   . Alcohol Use: No   OB History    No data available     Review of Systems  Constitutional: Negative for fever.  Eyes: Negative for pain and visual disturbance.  Cardiovascular: Negative for syncope.  Gastrointestinal: Positive for nausea and vomiting. Negative for abdominal pain.  Musculoskeletal: Negative for neck pain and neck stiffness.  Neurological: Positive for headaches. Negative for dizziness, tremors, focal weakness, seizures, syncope, facial asymmetry, speech difficulty, weakness, numbness and paresthesias.  All other systems reviewed and are negative.    Allergies  Codeine  Home Medications   Prior to Admission medications   Medication Sig Start Date End Date Taking? Authorizing Provider  albuterol (PROVENTIL HFA;VENTOLIN HFA) 108 (90 BASE) MCG/ACT inhaler Inhale 2 puffs into the lungs every 4 (four) hours as needed (cough, wheezing or shortness of breath). 07/11/13   John Molpus, MD  ciprofloxacin (CIPRO) 500 MG tablet Take 1 tablet (500 mg total) by mouth 2 (two) times daily. 11/19/12   Rolan BuccoMelanie Belfi, MD  fluticasone (FLONASE) 50 MCG/ACT nasal spray Place 2 sprays into both nostrils daily. 03/17/14   Nalayah Hitt, MD  hydrocodone-acetaminophen (HYCET) 7.5-325 MG/15ML solution Take 10 mLs by mouth 4 (four) times daily as needed for pain. 09/23/12   John Molpus, MD  HYDROcodone-acetaminophen (NORCO/VICODIN) 5-325 MG per tablet Take 2 tablets by mouth every 4 (four) hours as needed for pain. 11/19/12   Rolan BuccoMelanie Belfi, MD  ibuprofen (ADVIL,MOTRIN) 200 MG tablet Take 800 mg by mouth every 6 (six) hours as needed.    Historical  Provider, MD  ibuprofen (ADVIL,MOTRIN) 800 MG tablet Take 1 tablet (800 mg total) by mouth 3 (three) times daily. 03/17/14   Yekaterina Escutia, MD  oxyCODONE-acetaminophen (PERCOCET/ROXICET) 5-325 MG per tablet Take 1 tablet by mouth every 4 (four) hours as needed for pain. 12/10/12   Margarita Grizzle, MD  Phenylephrine-DM-GG-APAP (SUDAFED PE COLD/COUGH PO) Take by  mouth.    Historical Provider, MD  prochlorperazine (COMPAZINE) 10 MG tablet Take 1 tablet (10 mg total) by mouth 2 (two) times daily as needed. For migraine headache - take with  benadryl 11/21/12   Jones Skene, MD  promethazine (PHENERGAN) 12.5 MG suppository Place 1 suppository (12.5 mg total) rectally every 6 (six) hours as needed for nausea or vomiting. 03/17/14   Aubryanna Nesheim, MD  sulfamethoxazole-trimethoprim (SEPTRA DS) 800-160 MG per tablet Take 1 tablet by mouth every 12 (twelve) hours. 12/10/12   Margarita Grizzle, MD   BP 150/100 mmHg  Pulse 73  Temp(Src) 98 F (36.7 C) (Oral)  Resp 16  Ht 5' (1.524 m)  Wt 170 lb (77.111 kg)  BMI 33.20 kg/m2  SpO2 100%  LMP 10/16/2015   Physical Exam  Constitutional: She is oriented to person, place, and time. She appears well-developed and well-nourished. No distress.  HENT:  Head: Normocephalic and atraumatic.  Mouth/Throat: Oropharynx is clear and moist.  Eyes: Conjunctivae and EOM are normal. Pupils are equal, round, and reactive to light.  Neck: Normal range of motion. Neck supple. No tracheal deviation present.  Cardiovascular: Normal rate, regular rhythm and normal heart sounds.   Pulmonary/Chest: Effort normal and breath sounds normal. No respiratory distress. She has no wheezes. She has no rales.  Abdominal: Soft. Bowel sounds are normal. There is no tenderness. There is no rebound and no guarding.  Musculoskeletal: Normal range of motion.  Lymphadenopathy:    She has no cervical adenopathy.  Neurological: She is alert and oriented to person, place, and time. She has normal reflexes. She displays normal reflexes. No cranial nerve deficit. She exhibits normal muscle tone.  Skin: Skin is warm and dry.  Psychiatric: She has a normal mood and affect. Her behavior is normal.  Nursing note and vitals reviewed.   ED Course  Procedures (including critical care time) DIAGNOSTIC STUDIES: Oxygen Saturation is 100% on RA, normal by my  interpretation.    COORDINATION OF CARE: 11:29 PM-Discussed treatment plan with pt at bedside and pt agreed to plan.     Labs Review Labs Reviewed - No data to display  Imaging Review No results found.   EKG Interpretation None      MDM   Final diagnoses:  None   Medications  ondansetron (ZOFRAN-ODT) disintegrating tablet 8 mg (8 mg Oral Given 10/17/15 0037)  metoCLOPramide (REGLAN) injection 10 mg (10 mg Intramuscular Given 10/17/15 0037)  dexamethasone (DECADRON) injection 10 mg (10 mg Intramuscular Given 10/17/15 0038)  ketorolac (TORADOL) injection 60 mg (60 mg Intramuscular Given 10/17/15 0111)   Resting comfortably post medication.  Patient has frequent headaches and this is nothing atypical.  Intact EOM, no changes in cognition no neuro deficits.  No indication for CT LP or MRV.  Highly doubt acute intracranial pathology.  Follow up with your headache specialist for ongoing care    I personally performed the services described in this documentation, which was scribed in my presence. The recorded information has been reviewed and is accurate.        Cy Blamer, MD 10/17/15 [phone]

## 2015-10-16 NOTE — ED Notes (Signed)
Patient stats that she has had a "Migraine" with N/V/D

## 2015-10-17 ENCOUNTER — Encounter (HOSPITAL_BASED_OUTPATIENT_CLINIC_OR_DEPARTMENT_OTHER): Payer: Self-pay | Admitting: Emergency Medicine

## 2015-10-17 LAB — PREGNANCY, URINE: Preg Test, Ur: NEGATIVE

## 2015-10-17 MED ORDER — METOCLOPRAMIDE HCL 10 MG PO TABS: 10.0000 mg | ORAL_TABLET | Freq: Four times a day (QID) | ORAL | Status: DC

## 2015-10-17 MED ORDER — KETOROLAC TROMETHAMINE 60 MG/2ML IM SOLN
60.0000 mg | Freq: Once | INTRAMUSCULAR | Status: AC
  Administered 2015-10-17: 60 mg via INTRAMUSCULAR
  Filled 2015-10-17: qty 2

## 2015-10-17 NOTE — ED Notes (Signed)
C/o HA, onset last night along with nvd, also admits to blurry vision and dizziness, (denies: fever, sob), no relief with ibuprofen PTA.

## 2015-10-17 NOTE — Discharge Instructions (Signed)

## 2016-01-16 ENCOUNTER — Emergency Department (HOSPITAL_BASED_OUTPATIENT_CLINIC_OR_DEPARTMENT_OTHER)
Admission: EM | Admit: 2016-01-16 | Discharge: 2016-01-16 | Disposition: A | Discharge status: Home / Self Care | Payer: Medicaid Other | Attending: Emergency Medicine | Admitting: Emergency Medicine

## 2016-01-16 ENCOUNTER — Encounter (HOSPITAL_BASED_OUTPATIENT_CLINIC_OR_DEPARTMENT_OTHER): Payer: Self-pay

## 2016-01-16 DIAGNOSIS — R51 Headache: Secondary | ICD-10-CM | POA: Diagnosis not present

## 2016-01-16 DIAGNOSIS — R42 Dizziness and giddiness: Secondary | ICD-10-CM | POA: Diagnosis not present

## 2016-01-16 DIAGNOSIS — Z7951 Long term (current) use of inhaled steroids: Secondary | ICD-10-CM | POA: Diagnosis not present

## 2016-01-16 DIAGNOSIS — R519 Headache, unspecified: Secondary | ICD-10-CM

## 2016-01-16 DIAGNOSIS — R110 Nausea: Secondary | ICD-10-CM | POA: Diagnosis not present

## 2016-01-16 DIAGNOSIS — Z792 Long term (current) use of antibiotics: Secondary | ICD-10-CM | POA: Insufficient documentation

## 2016-01-16 DIAGNOSIS — R195 Other fecal abnormalities: Secondary | ICD-10-CM

## 2016-01-16 DIAGNOSIS — Z79899 Other long term (current) drug therapy: Secondary | ICD-10-CM | POA: Insufficient documentation

## 2016-01-16 DIAGNOSIS — R197 Diarrhea, unspecified: Secondary | ICD-10-CM | POA: Insufficient documentation

## 2016-01-16 MED ORDER — METOCLOPRAMIDE HCL 10 MG PO TABS: 10.0000 mg | ORAL_TABLET | Freq: Once | ORAL | Status: DC

## 2016-01-16 MED ORDER — IBUPROFEN 800 MG PO TABS: 800.0000 mg | ORAL_TABLET | Freq: Three times a day (TID) | ORAL | Status: DC

## 2016-01-16 MED ORDER — METOCLOPRAMIDE HCL 10 MG PO TABS: 10.0000 mg | ORAL_TABLET | Freq: Four times a day (QID) | ORAL | Status: DC

## 2016-01-16 MED ORDER — IBUPROFEN 800 MG PO TABS: 800.0000 mg | ORAL_TABLET | Freq: Once | ORAL | Status: DC

## 2016-01-16 NOTE — ED Notes (Signed)
HA x 1 hour, diarrhea x this am-NAD-family x 2 also pts

## 2016-01-16 NOTE — ED Provider Notes (Signed)
CSN: [id number]     Arrival date & time 01/16/16  1710 History  By signing my name below, I, Tanda Rockers, attest that this documentation has been prepared under the direction and in the presence of [employer], PA-C. Electronically Signed: Tanda Rockers, ED Scribe. 01/16/2016. 6:41 PM.   Chief Complaint  Patient presents with  . Headache   The history is provided by the patient. No language interpreter was used.     HPI Comments: Destiny Pugh is a 25 y.o. female who presents to the Emergency Department complaining of gradual onset, constant, headache that began this morning. She reports that the headache is exacerbated in sunlight. She has hx of headaches which she states are similar. Pt also complains of dizziness, diarrhea, and nausea from the headache. She has not taken any medication for her symptoms. Her daughter is having similar symptoms and pt's son recently had sick contact with friend who has the flu. Denies abdominal pain, hematochezia, urinary issues, vomiting, fever, or any other associated symptoms.    History reviewed. No pertinent past medical history. Past Surgical History  Procedure Laterality Date  . Fracture surgery    . Cesarean section    . Hand surgery    . Arm surgery     No family history on file. Social History  Substance Use Topics  . Smoking status: Never Smoker   . Smokeless tobacco: None  . Alcohol Use: No   OB History    No data available     Review of Systems  Constitutional: Negative for fever.  Gastrointestinal: Positive for nausea and diarrhea. Negative for vomiting, abdominal pain and blood in stool.  Genitourinary: Negative for dysuria, urgency, frequency, hematuria, decreased urine volume and difficulty urinating.  Neurological: Positive for dizziness and headaches.  All other systems reviewed and are negative.     Allergies  Codeine  Home Medications   Prior to Admission medications   Medication Sig Start Date End  Date Taking? Authorizing Provider  albuterol (PROVENTIL HFA;VENTOLIN HFA) 108 (90 BASE) MCG/ACT inhaler Inhale 2 puffs into the lungs every 4 (four) hours as needed (cough, wheezing or shortness of breath). 07/11/13   John Molpus, MD  ciprofloxacin (CIPRO) 500 MG tablet Take 1 tablet (500 mg total) by mouth 2 (two) times daily. 11/19/12   Rolan Bucco, MD  fluticasone (FLONASE) 50 MCG/ACT nasal spray Place 2 sprays into both nostrils daily. 03/17/14   April Palumbo, MD  hydrocodone-acetaminophen (HYCET) 7.5-325 MG/15ML solution Take 10 mLs by mouth 4 (four) times daily as needed for pain. 09/23/12   John Molpus, MD  HYDROcodone-acetaminophen (NORCO/VICODIN) 5-325 MG per tablet Take 2 tablets by mouth every 4 (four) hours as needed for pain. 11/19/12   Rolan Bucco, MD  ibuprofen (ADVIL,MOTRIN) 800 MG tablet Take 1 tablet (800 mg total) by mouth 3 (three) times daily. 01/16/16   Joycie Peek, PA-C  metoCLOPramide (REGLAN) 10 MG tablet Take 1 tablet (10 mg total) by mouth every 6 (six) hours. 01/16/16   Joycie Peek, PA-C  oxyCODONE-acetaminophen (PERCOCET/ROXICET) 5-325 MG per tablet Take 1 tablet by mouth every 4 (four) hours as needed for pain. 12/10/12   Margarita Grizzle, MD  Phenylephrine-DM-GG-APAP (SUDAFED PE COLD/COUGH PO) Take by mouth.    Historical Provider, MD  prochlorperazine (COMPAZINE) 10 MG tablet Take 1 tablet (10 mg total) by mouth 2 (two) times daily as needed. For migraine headache - take with  benadryl 11/21/12   Jones Skene, MD  promethazine (PHENERGAN) 12.5 MG suppository Place  1 suppository (12.5 mg total) rectally every 6 (six) hours as needed for nausea or vomiting. 03/17/14   April Palumbo, MD  sulfamethoxazole-trimethoprim (SEPTRA DS) 800-160 MG per tablet Take 1 tablet by mouth every 12 (twelve) hours. 12/10/12   Margarita Grizzle, MD   BP 150/99 mmHg  Pulse 70  Temp(Src) 98.5 F (36.9 C) (Oral)  Resp 16  Ht  (1.499 m)  Wt 77.111 kg  BMI 34.32 kg/m2  SpO2 100%  LMP  01/07/2016   Physical Exam  Constitutional: She is oriented to person, place, and time. She appears well-developed and well-nourished. No distress.  HENT:  Head: Normocephalic and atraumatic.  Mouth/Throat: Oropharynx is clear and moist.  Eyes: Conjunctivae and EOM are normal.  Neck: Neck supple. No tracheal deviation present.  No cervical lymphadenopathy No nuchal rigidity or meningismus   Cardiovascular: Normal rate, regular rhythm and normal heart sounds.   Pulmonary/Chest: Effort normal and breath sounds normal. No respiratory distress. She has no wheezes. She has no rales.  Abdominal: Soft. There is no tenderness.  Musculoskeletal: Normal range of motion.  Neurological: She is alert and oriented to person, place, and time. No cranial nerve deficit. Coordination normal.  Skin: Skin is warm and dry. No rash noted.  Psychiatric: She has a normal mood and affect. Her behavior is normal.  Nursing note and vitals reviewed.   ED Course  Procedures (including critical care time)  DIAGNOSTIC STUDIES: Oxygen Saturation is 100% on RA, normal by my interpretation.    COORDINATION OF CARE: 6:40 PM-Discussed treatment plan with pt at bedside and pt agreed to plan.   Labs Review Labs Reviewed - No data to display  Imaging Review No results found.   EKG Interpretation None     Meds given in ED:  Medications - No data to display  Discharge Medication List as of 01/16/2016  7:14 PM     Filed Vitals:   01/16/16 1728  BP: 150/99  Pulse: 70  Temp: 98.5 F (36.9 C)  TempSrc: Oral  Resp: 16  Height:  (1.499 m)  Weight: 77.111 kg  SpO2: 100%    MDM  Destiny Pugh is a 25 y.o. female who comes in for evaluation of mild headache onset today that is improving without intervention. One episode of loose stool. Patient overall appears well, hemodynamically stable and afebrile. Exam is unremarkable, nonfocal neuro exam, abdomen is soft and nontender. Discussed with dramatic  support at home. Discussed return precautions including fever, worsening discomfort, vomiting or persistent diarrhea. She verbalizes understanding and agrees with this plan. The patient appears reasonably screened and/or stabilized for discharge and I doubt any other medical condition or other [facility] requiring further screening, evaluation, or treatment in the ED at this time prior to discharge.   Final diagnoses:  Nonintractable headache, unspecified chronicity pattern, unspecified headache type  Passage of loose stools    I personally performed the services described in this documentation, which was scribed in my presence. The recorded information has been reviewed and is accurate.     Joycie Peek, PA-C 01/17/16 1521  Nelva Nay, MD 01/18/16 [phone]

## 2016-01-16 NOTE — Discharge Instructions (Signed)
Follow-up with your doctor as needed for reevaluation. Take your medications as prescribed. Return to ED for any new or worsening symptoms as discussed.

## 2016-04-07 ENCOUNTER — Emergency Department (HOSPITAL_BASED_OUTPATIENT_CLINIC_OR_DEPARTMENT_OTHER)
Admission: EM | Admit: 2016-04-07 | Discharge: 2016-04-08 | Disposition: A | Discharge status: Home / Self Care | Payer: Medicaid Other | Attending: Emergency Medicine | Admitting: Emergency Medicine

## 2016-04-07 ENCOUNTER — Encounter (HOSPITAL_BASED_OUTPATIENT_CLINIC_OR_DEPARTMENT_OTHER): Payer: Self-pay | Admitting: Emergency Medicine

## 2016-04-07 DIAGNOSIS — Z79899 Other long term (current) drug therapy: Secondary | ICD-10-CM | POA: Insufficient documentation

## 2016-04-07 DIAGNOSIS — R69 Illness, unspecified: Secondary | ICD-10-CM

## 2016-04-07 DIAGNOSIS — J111 Influenza due to unidentified influenza virus with other respiratory manifestations: Secondary | ICD-10-CM

## 2016-04-07 DIAGNOSIS — R519 Headache, unspecified: Secondary | ICD-10-CM

## 2016-04-07 DIAGNOSIS — Z791 Long term (current) use of non-steroidal anti-inflammatories (NSAID): Secondary | ICD-10-CM | POA: Insufficient documentation

## 2016-04-07 DIAGNOSIS — R51 Headache: Secondary | ICD-10-CM | POA: Insufficient documentation

## 2016-04-07 LAB — URINE MICROSCOPIC-ADD ON

## 2016-04-07 LAB — URINALYSIS, ROUTINE W REFLEX MICROSCOPIC
Bilirubin Urine: NEGATIVE
GLUCOSE, UA: NEGATIVE mg/dL
Ketones, ur: 15 mg/dL — AB
Nitrite: NEGATIVE
PROTEIN: NEGATIVE mg/dL
SPECIFIC GRAVITY, URINE: 1.021 (ref 1.005–1.030)
pH: 6.5 (ref 5.0–8.0)

## 2016-04-07 LAB — PREGNANCY, URINE: PREG TEST UR: NEGATIVE

## 2016-04-07 MED ORDER — DIPHENHYDRAMINE HCL 50 MG/ML IJ SOLN
25.0000 mg | Freq: Once | INTRAMUSCULAR | Status: AC
  Administered 2016-04-07: 25 mg via INTRAVENOUS
  Filled 2016-04-07: qty 1

## 2016-04-07 MED ORDER — SODIUM CHLORIDE 0.9 % IV BOLUS (SEPSIS)
1000.0000 mL | Freq: Once | INTRAVENOUS | Status: AC
  Administered 2016-04-07: 1000 mL via INTRAVENOUS

## 2016-04-07 MED ORDER — METOCLOPRAMIDE HCL 5 MG/ML IJ SOLN
10.0000 mg | Freq: Once | INTRAMUSCULAR | Status: AC
  Administered 2016-04-08: 10 mg via INTRAVENOUS
  Filled 2016-04-07: qty 2

## 2016-04-07 MED ORDER — KETOROLAC TROMETHAMINE 15 MG/ML IJ SOLN
15.0000 mg | Freq: Once | INTRAMUSCULAR | Status: AC
Start: 2016-04-07 — End: 2016-04-08
  Administered 2016-04-08: 15 mg via INTRAVENOUS
  Filled 2016-04-07: qty 1

## 2016-04-07 NOTE — ED Provider Notes (Signed)
CSN: [id number]     Arrival date & time 04/07/16  2208 History  By signing my name below, I, Destiny [id number] Pugh, attest that this documentation has been prepared under the direction and in the presence of Destiny LibraJohn Cloys Vera, MD. Electronically Signed: Bethel BornBritney Pugh, ED Scribe. 04/07/2016. 11:28 PM   Chief Complaint  Patient presents with  . Migraine    The history is provided by the patient. No language interpreter was used.   Destiny Pugh is a 25 y.o. female who presents to the Emergency Department complaining of a constant, severe, throbbing, bilateral temporal headache (L>R) with onset this morning. Ibuprofen provided insufficient pain relief at home. She notes that the pain worsened throughout the day and now feels similar to migraines that she had in the past. She has had flulike symptoms since this morning including nausea, 3 episodes of emesis, diarrhea, myalgias and chills. Pt denies coughing, SOB, and abdominal pain.   History reviewed. No pertinent past medical history. Past Surgical History  Procedure Laterality Date  . Fracture surgery    . Cesarean section    . Hand surgery    . Arm surgery     History reviewed. No pertinent family history. Social History  Substance Use Topics  . Smoking status: Never Smoker   . Smokeless tobacco: None  . Alcohol Use: No   OB History    No data available     Review of Systems  10 Systems reviewed and all are negative for acute change except as noted in the HPI.  Allergies  Codeine  Home Medications   Prior to Admission medications   Medication Sig Start Date End Date Taking? Authorizing Provider  albuterol (PROVENTIL HFA;VENTOLIN HFA) 108 (90 BASE) MCG/ACT inhaler Inhale 2 puffs into the lungs every 4 (four) hours as needed (cough, wheezing or shortness of breath). 07/11/13   Bostyn Bogie, MD  fluticasone (FLONASE) 50 MCG/ACT nasal spray Place 2 sprays into both nostrils daily. 03/17/14   April Palumbo, MD  ibuprofen (ADVIL,MOTRIN)  800 MG tablet Take 1 tablet (800 mg total) by mouth 3 (three) times daily. 01/16/16   Joycie PeekBenjamin Cartner, PA-C  metoCLOPramide (REGLAN) 10 MG tablet Take 1 tablet (10 mg total) by mouth every 6 (six) hours. 01/16/16   Joycie PeekBenjamin Cartner, PA-C  ondansetron (ZOFRAN ODT) 8 MG disintegrating tablet Take 1 tablet (8 mg total) by mouth every 8 (eight) hours as needed for nausea or vomiting. 04/08/16   Destiny LibraJohn Harmani Neto, MD  Phenylephrine-DM-GG-APAP (SUDAFED PE COLD/COUGH PO) Take by mouth.    Historical Provider, MD   BP 158/102 mmHg  Pulse 91  Temp(Src) 100 F (37.8 C) (Oral)  Resp 18  Ht 4\' 11"  (1.499 m)  Wt 179 lb (81.194 kg)  BMI 36.13 kg/m2  SpO2 99%  LMP 03/18/2016 Physical Exam General: Well-developed, well-nourished female in no acute distress; appearance consistent with age of record HENT: normocephalic; atraumatic Eyes: pupils equal, round and reactive to light; extraocular muscles intact, photophobia Neck: supple Heart: regular rate and rhythm Lungs: clear to auscultation bilaterally Abdomen: soft; nondistended; nontender; no masses or hepatosplenomegaly; bowel sounds present Extremities: No deformity; full range of motion; pulses normal Neurologic: Awake, alert and oriented; motor function intact in all extremities and symmetric; no facial droop Skin: Warm and dry Psychiatric: Flat affect  ED Course  Procedures (including critical care time) DIAGNOSTIC STUDIES: Oxygen Saturation is 99% on RA,  normal by my interpretation.     MDM   Nursing notes and vitals signs, including pulse oximetry,  reviewed.  Summary of this visit's results, reviewed by myself:  Labs:  Results for orders placed or performed during the hospital encounter of 04/07/16 (from the past 24 hour(s))  Urinalysis, Routine w reflex microscopic (not at [facility])     Status: Abnormal   Collection Time: 04/07/16 11:20 PM  Result Value Ref Range   Color, Urine YELLOW YELLOW   APPearance CLOUDY (A) CLEAR   Specific Gravity,  Urine 1.021 1.005 - 1.030   pH 6.5 5.0 - 8.0   Glucose, UA NEGATIVE NEGATIVE mg/dL   Hgb urine dipstick SMALL (A) NEGATIVE   Bilirubin Urine NEGATIVE NEGATIVE   Ketones, ur 15 (A) NEGATIVE mg/dL   Protein, ur NEGATIVE NEGATIVE mg/dL   Nitrite NEGATIVE NEGATIVE   Leukocytes, UA SMALL (A) NEGATIVE  Pregnancy, urine     Status: None   Collection Time: 04/07/16 11:20 PM  Result Value Ref Range   Preg Test, Ur NEGATIVE NEGATIVE  Urine microscopic-add on     Status: Abnormal   Collection Time: 04/07/16 11:20 PM  Result Value Ref Range   Squamous Epithelial / LPF 0-5 (A) NONE SEEN   WBC, UA 6-30 0 - 5 WBC/hpf   RBC / HPF 6-30 0 - 5 RBC/hpf   Bacteria, UA MANY (A) NONE SEEN   1:16 AM Patient feeling better after IV fluids and medications. Urine sent for culture will treat with fosfomycin in the ED for possible UTI.  Final diagnoses:  Influenza-like illness  Bad headache   I personally performed the services described in this documentation, which was scribed in my presence. The recorded information has been reviewed and is accurate.    Destiny Libra, MD 04/08/16 [phone]

## 2016-04-07 NOTE — ED Notes (Addendum)
Pt states she had a H/A earlier this am. Also had some vomiting and diarrhea. Felt better, but later developed chills. "H/A turned into a migraine". Now c/o congestion, chills and body aches. Feels weak.

## 2016-04-07 NOTE — ED Notes (Signed)
Pt in c/o migraine onset this am, home tx with ibuprofen with no relief. Also c/o nausea.

## 2016-04-07 NOTE — ED Notes (Signed)
MD at bedside. 

## 2016-04-08 MED ORDER — FOSFOMYCIN TROMETHAMINE 3 G PO PACK
3.0000 g | PACK | Freq: Once | ORAL | Status: AC
  Administered 2016-04-08: 3 g via ORAL
  Filled 2016-04-08: qty 3

## 2016-04-08 MED ORDER — ONDANSETRON 8 MG PO TBDP: 8.0000 mg | ORAL_TABLET | Freq: Three times a day (TID) | ORAL | Status: DC | PRN

## 2016-04-08 NOTE — ED Notes (Signed)
MD at bedside. 

## 2016-04-08 NOTE — ED Notes (Signed)
pT GIVEN D/C INSTRUCTIONS AS PER CHART. vERBALIZES UNDERSTANDING. nO QUESTIONS. r X 1

## 2016-04-10 LAB — URINE CULTURE: Culture: >=100000 — AB

## 2016-04-11 ENCOUNTER — Telehealth: Payer: Self-pay | Admitting: *Deleted

## 2016-04-11 NOTE — ED Notes (Signed)
Post ED Visit - Positive Culture Follow-up  Culture report reviewed by antimicrobial stewardship pharmacist:  []  Enzo BiNathan Batchelder, Pharm.D. []  Celedonio MiyamotoJeremy Frens, Pharm.D., BCPS []  Garvin FilaMike Maccia, Pharm.D. []  Georgina PillionElizabeth Martin, Pharm.D., BCPS []  Quebrada PrietaMinh Pham, [address]Ph[address]D., BCPS, AAHIVP []  Estella HuskMichelle Turner, Pharm.D., BCPS, AAHIVP [x]  Tennis Mustassie Stewart, Pharm.D. []  Sherle Poeob Vincent, [address]Pharm[address]  Positive urine culture Treated with Fosfomycin in the ED and no further patient follow-up is required at this time.  Virl AxeRobertson, Nevae Pinnix Talley 04/11/2016, 10:10 AM

## 2016-07-07 ENCOUNTER — Emergency Department (HOSPITAL_BASED_OUTPATIENT_CLINIC_OR_DEPARTMENT_OTHER)
Admission: EM | Admit: 2016-07-07 | Discharge: 2016-07-07 | Disposition: A | Discharge status: Home / Self Care | Payer: Medicaid Other | Attending: Emergency Medicine | Admitting: Emergency Medicine

## 2016-07-07 ENCOUNTER — Encounter (HOSPITAL_BASED_OUTPATIENT_CLINIC_OR_DEPARTMENT_OTHER): Payer: Self-pay | Admitting: Emergency Medicine

## 2016-07-07 DIAGNOSIS — Y999 Unspecified external cause status: Secondary | ICD-10-CM | POA: Insufficient documentation

## 2016-07-07 DIAGNOSIS — G43001 Migraine without aura, not intractable, with status migrainosus: Secondary | ICD-10-CM

## 2016-07-07 DIAGNOSIS — Y939 Activity, unspecified: Secondary | ICD-10-CM | POA: Insufficient documentation

## 2016-07-07 DIAGNOSIS — Z791 Long term (current) use of non-steroidal anti-inflammatories (NSAID): Secondary | ICD-10-CM | POA: Insufficient documentation

## 2016-07-07 DIAGNOSIS — T148XXA Other injury of unspecified body region, initial encounter: Secondary | ICD-10-CM

## 2016-07-07 DIAGNOSIS — S0083XA Contusion of other part of head, initial encounter: Secondary | ICD-10-CM | POA: Insufficient documentation

## 2016-07-07 DIAGNOSIS — Y92410 Unspecified street and highway as the place of occurrence of the external cause: Secondary | ICD-10-CM | POA: Insufficient documentation

## 2016-07-07 LAB — PREGNANCY, URINE: Preg Test, Ur: NEGATIVE

## 2016-07-07 MED ORDER — SODIUM CHLORIDE 0.9 % IV BOLUS (SEPSIS)
1000.0000 mL | Freq: Once | INTRAVENOUS | Status: AC
  Administered 2016-07-07: 1000 mL via INTRAVENOUS

## 2016-07-07 MED ORDER — KETOROLAC TROMETHAMINE 30 MG/ML IJ SOLN
30.0000 mg | Freq: Once | INTRAMUSCULAR | Status: AC
  Administered 2016-07-07: 30 mg via INTRAVENOUS
  Filled 2016-07-07: qty 1

## 2016-07-07 MED ORDER — DIPHENHYDRAMINE HCL 50 MG/ML IJ SOLN
25.0000 mg | Freq: Once | INTRAMUSCULAR | Status: AC
  Administered 2016-07-07: 25 mg via INTRAVENOUS
  Filled 2016-07-07: qty 1

## 2016-07-07 MED ORDER — PROCHLORPERAZINE EDISYLATE 5 MG/ML IJ SOLN
10.0000 mg | Freq: Once | INTRAMUSCULAR | Status: AC
  Administered 2016-07-07: 10 mg via INTRAVENOUS
  Filled 2016-07-07: qty 2

## 2016-07-07 NOTE — ED Notes (Signed)
Pt given d/c instructions as per chart. Verbalizes understanding. No questions. 
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
## 2016-07-07 NOTE — ED Provider Notes (Signed)
[facility] Provider Note   CSN: [id number] Arrival date & time: 07/07/16  1742  By signing my name below, I, Alyssa Grove, attest that this documentation has been prepared under the direction and in the presence of Melene Plan, DO. Electronically Signed: Alyssa Grove, ED Scribe. 07/07/16. 6:14 PM.  First MD Initiated Contact with Patient 07/07/16 1806    History   Chief Complaint Chief Complaint  Patient presents with  . Headache    knot on head    The history is provided by the patient. No language interpreter was used.    HPI Comments: Destiny Pugh is a 25 y.o. female who presents to the Emergency Department complaining of a sudden onset, constant headache onset a few hours PTA. Pt struck her head on the trunk of her vehicle. Pt states she is dizzy and notes facial swelling on the forehead. Pt states this feels similar in type, but worse in severity to her migraine headache. Pt denies LOC.   History reviewed. No pertinent past medical history.  There are no active problems to display for this patient.   Past Surgical History:  Procedure Laterality Date  . arm surgery    . CESAREAN SECTION    . FRACTURE SURGERY    . HAND SURGERY      OB History    No data available       Home Medications    Prior to Admission medications   Medication Sig Start Date End Date Taking? Authorizing Provider  ibuprofen (ADVIL,MOTRIN) 800 MG tablet Take 1 tablet (800 mg total) by mouth 3 (three) times daily. 01/16/16  Yes Joycie Peek, PA-C  albuterol (PROVENTIL HFA;VENTOLIN HFA) 108 (90 BASE) MCG/ACT inhaler Inhale 2 puffs into the lungs every 4 (four) hours as needed (cough, wheezing or shortness of breath). 07/11/13   John Molpus, MD  fluticasone (FLONASE) 50 MCG/ACT nasal spray Place 2 sprays into both nostrils daily. 03/17/14   April Palumbo, MD  metoCLOPramide (REGLAN) 10 MG tablet Take 1 tablet (10 mg total) by mouth every 6 (six) hours. 01/16/16   Joycie Peek, PA-C   ondansetron (ZOFRAN ODT) 8 MG disintegrating tablet Take 1 tablet (8 mg total) by mouth every 8 (eight) hours as needed for nausea or vomiting. 04/08/16   Paula Libra, MD  Phenylephrine-DM-GG-APAP (SUDAFED PE COLD/COUGH PO) Take by mouth.    Historical Provider, MD    Family History History reviewed. No pertinent family history.  Social History Social History  Substance Use Topics  . Smoking status: Never Smoker  . Smokeless tobacco: Never Used  . Alcohol use No     Allergies   Codeine   Review of Systems Review of Systems  Constitutional: Negative for chills and fever.  HENT: Positive for facial swelling. Negative for congestion and rhinorrhea.   Eyes: Negative for redness and visual disturbance.  Respiratory: Negative for shortness of breath and wheezing.   Cardiovascular: Negative for chest pain and palpitations.  Gastrointestinal: Negative for nausea and vomiting.  Genitourinary: Negative for dysuria and urgency.  Musculoskeletal: Negative for arthralgias and myalgias.  Skin: Negative for pallor and wound.  Neurological: Positive for dizziness and headaches. Negative for syncope.  All other systems reviewed and are negative.    Physical Exam Updated Vital Signs BP 140/96 (BP Location: Right Arm)   Pulse 77   Temp 98.2 F (36.8 C) (Oral)   Resp 16   Ht 4\' 11"  (1.499 m)   Wt 170 lb (77.1 kg)   LMP  07/06/2016 (Exact Date)   SpO2 100%   BMI 34.34 kg/m   Physical Exam  Constitutional: She is oriented to person, place, and time. She appears well-developed and well-nourished. No distress.  HENT:  Head: Normocephalic and atraumatic.  Hematoma to the forehead  Eyes: EOM are normal. Pupils are equal, round, and reactive to light.  Neck: Normal range of motion. Neck supple.  Cardiovascular: Normal rate and regular rhythm.  Exam reveals no gallop and no friction rub.   No murmur heard. Pulmonary/Chest: Effort normal. She has no wheezes. She has no rales.    Abdominal: Soft. She exhibits no distension. There is no tenderness.  Musculoskeletal: She exhibits no edema or tenderness.  Neurological: She is alert and oriented to person, place, and time. She has normal strength. No cranial nerve deficit or sensory deficit. She displays a negative Romberg sign. Coordination and gait normal. GCS eye subscore is 4. GCS verbal subscore is 5. GCS motor subscore is 6. She displays no Babinski's sign on the right side. She displays no Babinski's sign on the left side.  Nml Neuro exam  Skin: Skin is warm and dry. She is not diaphoretic.  Psychiatric: She has a normal mood and affect. Her behavior is normal.  Nursing note and vitals reviewed.    ED Treatments / Results  DIAGNOSTIC STUDIES: Oxygen Saturation is 100% on RA, normal by my interpretation.    COORDINATION OF CARE: 6:11 PM Discussed treatment plan with pt at bedside which includes Compazine and Benadryl and pregnancy, urine and pt agreed to plan.  Labs (all labs ordered are listed, but only abnormal results are displayed) Labs Reviewed  PREGNANCY, URINE    EKG  EKG Interpretation None       Radiology No results found.  Procedures Procedures (including critical care time)  Medications Ordered in ED Medications  prochlorperazine (COMPAZINE) injection 10 mg (10 mg Intravenous Given 07/07/16 1832)  diphenhydrAMINE (BENADRYL) injection 25 mg (25 mg Intravenous Given 07/07/16 1830)  sodium chloride 0.9 % bolus 1,000 mL (0 mLs Intravenous Stopped 07/07/16 1935)  ketorolac (TORADOL) 30 MG/ML injection 30 mg (30 mg Intravenous Given 07/07/16 1856)     Initial Impression / Assessment and Plan / ED Course  I have reviewed the triage vital signs and the nursing notes.  Pertinent labs & imaging results that were available during my care of the patient were reviewed by me and considered in my medical decision making (see chart for details).  Clinical Course    25 yo F With a chief  complaint of a migraine headache. Feels like her prior migraines but is more intense. This occurred after she struck her forehead on the trunk of the car. I doubt intracranial injury. Will treat headache. Reassess.  Feeling much better, d/c home.   Final Clinical Impressions(s) / ED Diagnoses   Final diagnoses:  Hematoma  Migraine without aura and with status migrainosus, not intractable    New Prescriptions Discharge Medication List as of 07/07/2016  7:31 PM      I personally performed the services described in this documentation, which was scribed in my presence. The recorded information has been reviewed and is accurate.   9:18 PM:  I have discussed the diagnosis/risks/treatment options with the patient and family and believe the pt to be eligible for discharge home to follow-up with PCP. We also discussed returning to the ED immediately if new or worsening sx occur. We discussed the sx which are most concerning (e.g., sudden worsening pain,  fever, inability to tolerate by mouth) that necessitate immediate return. Medications administered to the patient during their visit and any new prescriptions provided to the patient are listed below.  Medications given during this visit Medications  prochlorperazine (COMPAZINE) injection 10 mg (10 mg Intravenous Given 07/07/16 1832)  diphenhydrAMINE (BENADRYL) injection 25 mg (25 mg Intravenous Given 07/07/16 1830)  sodium chloride 0.9 % bolus 1,000 mL (0 mLs Intravenous Stopped 07/07/16 1935)  ketorolac (TORADOL) 30 MG/ML injection 30 mg (30 mg Intravenous Given 07/07/16 1856)     The patient appears reasonably screen and/or stabilized for discharge and I doubt any other medical condition or other [facility] requiring further screening, evaluation, or treatment in the ED at this time prior to discharge.      Melene Plan, DO 07/07/16 2118

## 2016-07-25 ENCOUNTER — Emergency Department (HOSPITAL_BASED_OUTPATIENT_CLINIC_OR_DEPARTMENT_OTHER)
Admission: EM | Admit: 2016-07-25 | Discharge: 2016-07-25 | Disposition: A | Discharge status: Home / Self Care | Payer: Medicaid Other | Attending: Emergency Medicine | Admitting: Emergency Medicine

## 2016-07-25 ENCOUNTER — Encounter (HOSPITAL_BASED_OUTPATIENT_CLINIC_OR_DEPARTMENT_OTHER): Payer: Self-pay | Admitting: Emergency Medicine

## 2016-07-25 DIAGNOSIS — R079 Chest pain, unspecified: Secondary | ICD-10-CM | POA: Diagnosis present

## 2016-07-25 DIAGNOSIS — I10 Essential (primary) hypertension: Secondary | ICD-10-CM | POA: Diagnosis not present

## 2016-07-25 DIAGNOSIS — R0789 Other chest pain: Secondary | ICD-10-CM | POA: Diagnosis not present

## 2016-07-25 HISTORY — DX: Essential (primary) hypertension: I10

## 2016-07-25 HISTORY — DX: Migraine, unspecified, not intractable, without status migrainosus: G43.909

## 2016-07-25 MED ORDER — KETOROLAC TROMETHAMINE 30 MG/ML IJ SOLN
30.0000 mg | Freq: Once | INTRAMUSCULAR | Status: AC
  Administered 2016-07-25: 30 mg via INTRAMUSCULAR
  Filled 2016-07-25: qty 1
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
## 2016-08-02 ENCOUNTER — Emergency Department (HOSPITAL_BASED_OUTPATIENT_CLINIC_OR_DEPARTMENT_OTHER)
Admission: EM | Admit: 2016-08-02 | Discharge: 2016-08-02 | Disposition: A | Discharge status: Home / Self Care | Payer: Medicaid Other | Attending: Emergency Medicine | Admitting: Emergency Medicine

## 2016-08-02 ENCOUNTER — Encounter (HOSPITAL_BASED_OUTPATIENT_CLINIC_OR_DEPARTMENT_OTHER): Payer: Self-pay | Admitting: Emergency Medicine

## 2016-08-02 ENCOUNTER — Emergency Department (HOSPITAL_BASED_OUTPATIENT_CLINIC_OR_DEPARTMENT_OTHER): Payer: Medicaid Other

## 2016-08-02 DIAGNOSIS — I10 Essential (primary) hypertension: Secondary | ICD-10-CM | POA: Insufficient documentation

## 2016-08-02 DIAGNOSIS — M79621 Pain in right upper arm: Secondary | ICD-10-CM | POA: Insufficient documentation

## 2016-08-02 DIAGNOSIS — M79601 Pain in right arm: Secondary | ICD-10-CM | POA: Diagnosis present

## 2016-08-02 MED ORDER — NAPROXEN 500 MG PO TABS: 500.0000 mg | ORAL_TABLET | Freq: Two times a day (BID) | ORAL | 0 refills | Status: DC
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
## 2016-08-02 NOTE — ED Notes (Signed)
Pt. Wanting a work note from the Dr. Donnella ShamStating she has a sore arm and her job involved lifting.  Pt. Was told by RN that the PA Joni Reiningicole said she could return to work due to no injury that would keep her from going to work. Joni Reiningicole PA  said the pt. Had movement and grip and strength all in tact that allowed her to  Have full movement.  Pt. Was angry at time of discharge due to not getting a work note and not getting a brace and not getting a sling.  None were ordered for the Pt.  Pt. Was walked to Discharge window by RN Earlene Plateravis who told Pt. To have a good night.

## 2016-08-02 NOTE — Discharge Instructions (Signed)
Take your medications as needed for pain relief. You may also rest, elevate and apply ice to affected area for 15-20 minutes 3-4 times daily to help with pain. Follow-up with your primary care provider her symptoms have not improved over the next week. Please return to the Emergency Department if symptoms worsen or new onset of fever, redness, swelling, warmth, numbness, tingling, weakness.

## 2017-01-01 ENCOUNTER — Emergency Department (HOSPITAL_BASED_OUTPATIENT_CLINIC_OR_DEPARTMENT_OTHER)
Admission: EM | Admit: 2017-01-01 | Discharge: 2017-01-01 | Disposition: A | Discharge status: Home / Self Care | Payer: Medicaid Other | Attending: Dermatology | Admitting: Dermatology

## 2017-01-01 ENCOUNTER — Encounter (HOSPITAL_BASED_OUTPATIENT_CLINIC_OR_DEPARTMENT_OTHER): Payer: Self-pay

## 2017-01-01 DIAGNOSIS — M545 Low back pain: Secondary | ICD-10-CM | POA: Insufficient documentation

## 2017-01-01 DIAGNOSIS — I10 Essential (primary) hypertension: Secondary | ICD-10-CM | POA: Insufficient documentation

## 2017-01-01 DIAGNOSIS — Z5321 Procedure and treatment not carried out due to patient leaving prior to being seen by health care provider: Secondary | ICD-10-CM | POA: Insufficient documentation

## 2017-01-01 NOTE — ED Triage Notes (Signed)
C/o lower back pain x "few" days-denies injury-NAD-steady gait

## 2017-01-03 ENCOUNTER — Encounter (HOSPITAL_BASED_OUTPATIENT_CLINIC_OR_DEPARTMENT_OTHER): Payer: Self-pay | Admitting: *Deleted

## 2017-01-03 ENCOUNTER — Emergency Department (HOSPITAL_BASED_OUTPATIENT_CLINIC_OR_DEPARTMENT_OTHER)
Admission: EM | Admit: 2017-01-03 | Discharge: 2017-01-03 | Disposition: A | Discharge status: Home / Self Care | Payer: No Typology Code available for payment source | Attending: Emergency Medicine | Admitting: Emergency Medicine

## 2017-01-03 ENCOUNTER — Emergency Department (HOSPITAL_BASED_OUTPATIENT_CLINIC_OR_DEPARTMENT_OTHER): Payer: No Typology Code available for payment source

## 2017-01-03 DIAGNOSIS — Y92410 Unspecified street and highway as the place of occurrence of the external cause: Secondary | ICD-10-CM | POA: Diagnosis not present

## 2017-01-03 DIAGNOSIS — Z791 Long term (current) use of non-steroidal anti-inflammatories (NSAID): Secondary | ICD-10-CM | POA: Diagnosis not present

## 2017-01-03 DIAGNOSIS — Y999 Unspecified external cause status: Secondary | ICD-10-CM | POA: Insufficient documentation

## 2017-01-03 DIAGNOSIS — M545 Low back pain, unspecified: Secondary | ICD-10-CM

## 2017-01-03 DIAGNOSIS — I10 Essential (primary) hypertension: Secondary | ICD-10-CM | POA: Diagnosis not present

## 2017-01-03 DIAGNOSIS — Y9389 Activity, other specified: Secondary | ICD-10-CM | POA: Diagnosis not present

## 2017-01-03 DIAGNOSIS — S3992XA Unspecified injury of lower back, initial encounter: Secondary | ICD-10-CM | POA: Diagnosis present

## 2017-01-03 LAB — PREGNANCY, URINE: PREG TEST UR: NEGATIVE

## 2017-01-03 MED ORDER — CYCLOBENZAPRINE HCL 10 MG PO TABS: 10.0000 mg | ORAL_TABLET | Freq: Two times a day (BID) | ORAL | 0 refills | Status: DC | PRN

## 2017-01-03 MED ORDER — NAPROXEN 500 MG PO TABS: 500.0000 mg | ORAL_TABLET | Freq: Two times a day (BID) | ORAL | 0 refills | Status: DC
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
## 2017-01-03 NOTE — Discharge Instructions (Signed)
Return to the ED with any concerns including weakness of legs, not able to urinate, loss of control of bowel or bladder, decreased level of alertness/lethargy, or any other alarming symptoms °

## 2017-01-21 ENCOUNTER — Encounter (HOSPITAL_BASED_OUTPATIENT_CLINIC_OR_DEPARTMENT_OTHER): Payer: Self-pay | Admitting: Emergency Medicine

## 2017-01-21 ENCOUNTER — Emergency Department (HOSPITAL_BASED_OUTPATIENT_CLINIC_OR_DEPARTMENT_OTHER)
Admission: EM | Admit: 2017-01-21 | Discharge: 2017-01-21 | Disposition: A | Discharge status: Home / Self Care | Payer: Managed Care, Other (non HMO) | Attending: Emergency Medicine | Admitting: Emergency Medicine

## 2017-01-21 DIAGNOSIS — J020 Streptococcal pharyngitis: Secondary | ICD-10-CM | POA: Insufficient documentation

## 2017-01-21 DIAGNOSIS — J029 Acute pharyngitis, unspecified: Secondary | ICD-10-CM | POA: Diagnosis present

## 2017-01-21 DIAGNOSIS — I10 Essential (primary) hypertension: Secondary | ICD-10-CM | POA: Diagnosis not present

## 2017-01-21 LAB — RAPID STREP SCREEN (MED CTR MEBANE ONLY): Streptococcus, Group A Screen (Direct): POSITIVE — AB

## 2017-01-21 MED ORDER — ACETAMINOPHEN 325 MG PO TABS
650.0000 mg | ORAL_TABLET | Freq: Once | ORAL | Status: AC | PRN
  Administered 2017-01-21: 650 mg via ORAL
  Filled 2017-01-21: qty 2

## 2017-01-21 MED ORDER — PENICILLIN G BENZATHINE 1200000 UNIT/2ML IM SUSP
1.2000 10*6.[IU] | Freq: Once | INTRAMUSCULAR | Status: AC
  Administered 2017-01-21: 1.2 10*6.[IU] via INTRAMUSCULAR
  Filled 2017-01-21: qty 2

## 2017-01-21 NOTE — ED Notes (Signed)
Sore throat since MN. Daughter had strep last week.

## 2017-01-21 NOTE — ED Triage Notes (Signed)
Patient states that she started to have sore throat at midnight - denies any SOB or Fever. No distress noted at this time

## 2017-01-21 NOTE — ED Provider Notes (Signed)
   [facility] Provider Note: [name] [name], MD, FACEP  CSN: [id number] MRN: [id number] ARRIVAL: 01/21/17 at 0205 ROOM: [facility]   CHIEF COMPLAINT  Sore Throat   HISTORY OF PRESENT ILLNESS  Destiny Pugh is a 26 y.o. female whose daughter was recently treated for strep throat. She is here with a sore throat that began about midnight. She describes the pain as "really bad", worse with swallowing. She denies a fever. There is associated anterior cervical lymphadenopathy. Her voice is hoarse. She denies other symptoms.   Past Medical History:  Diagnosis Date  . Hypertension   . Migraine     Past Surgical History:  Procedure Laterality Date  . arm surgery    . CESAREAN SECTION    . CESAREAN SECTION    . FRACTURE SURGERY    . HAND SURGERY      History reviewed. No pertinent family history.  Social History  Substance Use Topics  . Smoking status: Never Smoker  . Smokeless tobacco: Never Used  . Alcohol use No    Prior to Admission medications   Medication Sig Start Date End Date Taking? Authorizing Provider  naproxen (NAPROSYN) 500 MG tablet Take 1 tablet (500 mg total) by mouth 2 (two) times daily. 01/03/17   Jerelyn ScottMartha Linker, MD    Allergies Codeine   REVIEW OF SYSTEMS  Negative except as noted here or in the History of Present Illness.   PHYSICAL EXAMINATION  Initial Vital Signs Blood pressure (!) 155/107, pulse 79, temperature 98.5 F (36.9 C), temperature source Oral, resp. rate 18, height 4\' 11"  (1.499 m), weight 181 lb (82.1 kg), last menstrual period 01/21/2017, SpO2 100 %.  Examination General: Well-developed, well-nourished female in no acute distress; appearance consistent with age of record HENT: normocephalic; atraumatic; dysphonia; pharyngeal erythema without exudate Eyes: pupils equal, round and reactive to light; extraocular muscles intact Neck: supple Heart: regular rate and rhythm Lungs: clear to auscultation bilaterally Abdomen:  soft; nondistended; nontender; bowel sounds present Extremities: No deformity; full range of motion Neurologic: Awake, alert and oriented; motor function intact in all extremities and symmetric; no facial droop Skin: Warm and dry Psychiatric: Flat affect   RESULTS  Summary of this visit's results, reviewed by myself:   EKG Interpretation  Date/Time:    Ventricular Rate:    PR Interval:    QRS Duration:   QT Interval:    QTC Calculation:   R Axis:     Text Interpretation:        Laboratory Studies: Results for orders placed or performed during the hospital encounter of 01/21/17 (from the past 24 hour(s))  Rapid strep screen     Status: Abnormal   Collection Time: 01/21/17  2:24 AM  Result Value Ref Range   Streptococcus, Group A Screen (Direct) POSITIVE (A) NEGATIVE   Imaging Studies: No results found.  ED COURSE  Nursing notes and initial vitals signs, including pulse oximetry, reviewed.  Vitals:   01/21/17 0210  BP: (!) 155/107  Pulse: 79  Resp: 18  Temp: 98.5 F (36.9 C)  TempSrc: Oral  SpO2: 100%  Weight: 181 lb (82.1 kg)  Height: 4\' 11"  (1.499 m)    PROCEDURES    ED DIAGNOSES     ICD-9-CM ICD-10-CM   1. Strep pharyngitis 034.0 J02.0        Paula LibraJohn Adarius Tigges, MD 01/21/17 [phone]
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
## 2017-06-22 IMAGING — DX DG HAND COMPLETE 3+V*R*
3 series · 3 of 3 positions shown · non-contrast
Comparison: None.

CLINICAL DATA: RIGHT arm pain after pulling injury 8 hours ago.

EXAM:
RIGHT HAND - COMPLETE 3+ VIEW; RIGHT ELBOW - COMPLETE 3+ VIEW; RIGHT
FOREARM - 2 VIEW

[hand pa]
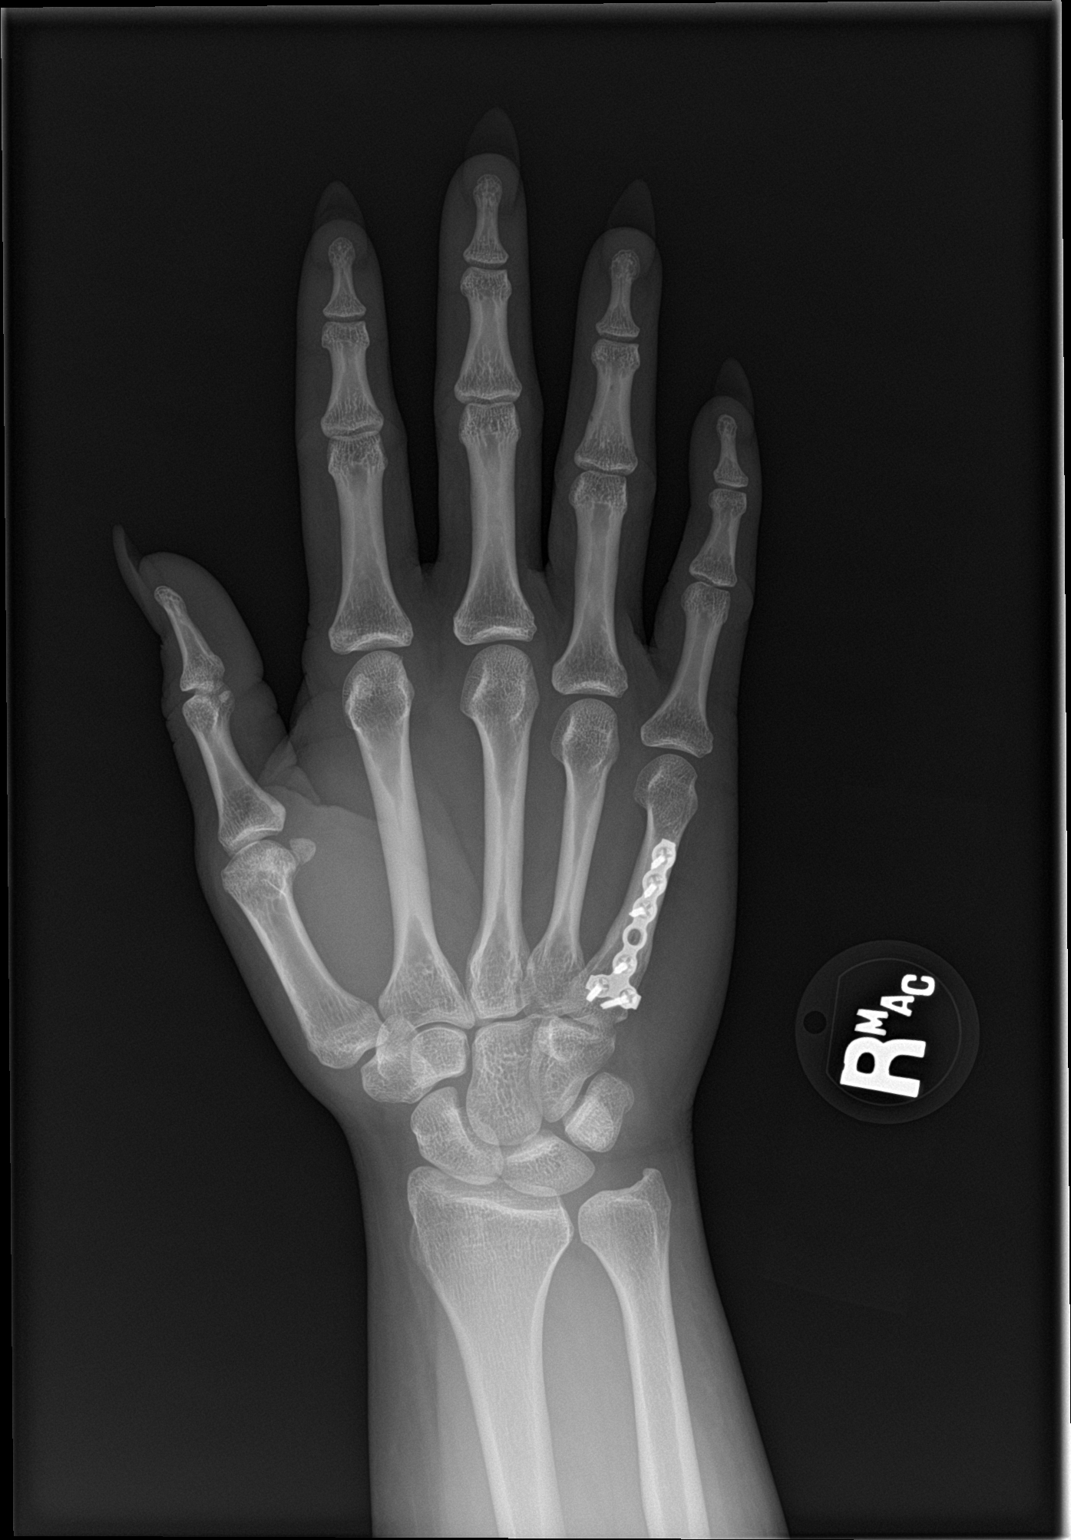

[hand obl]
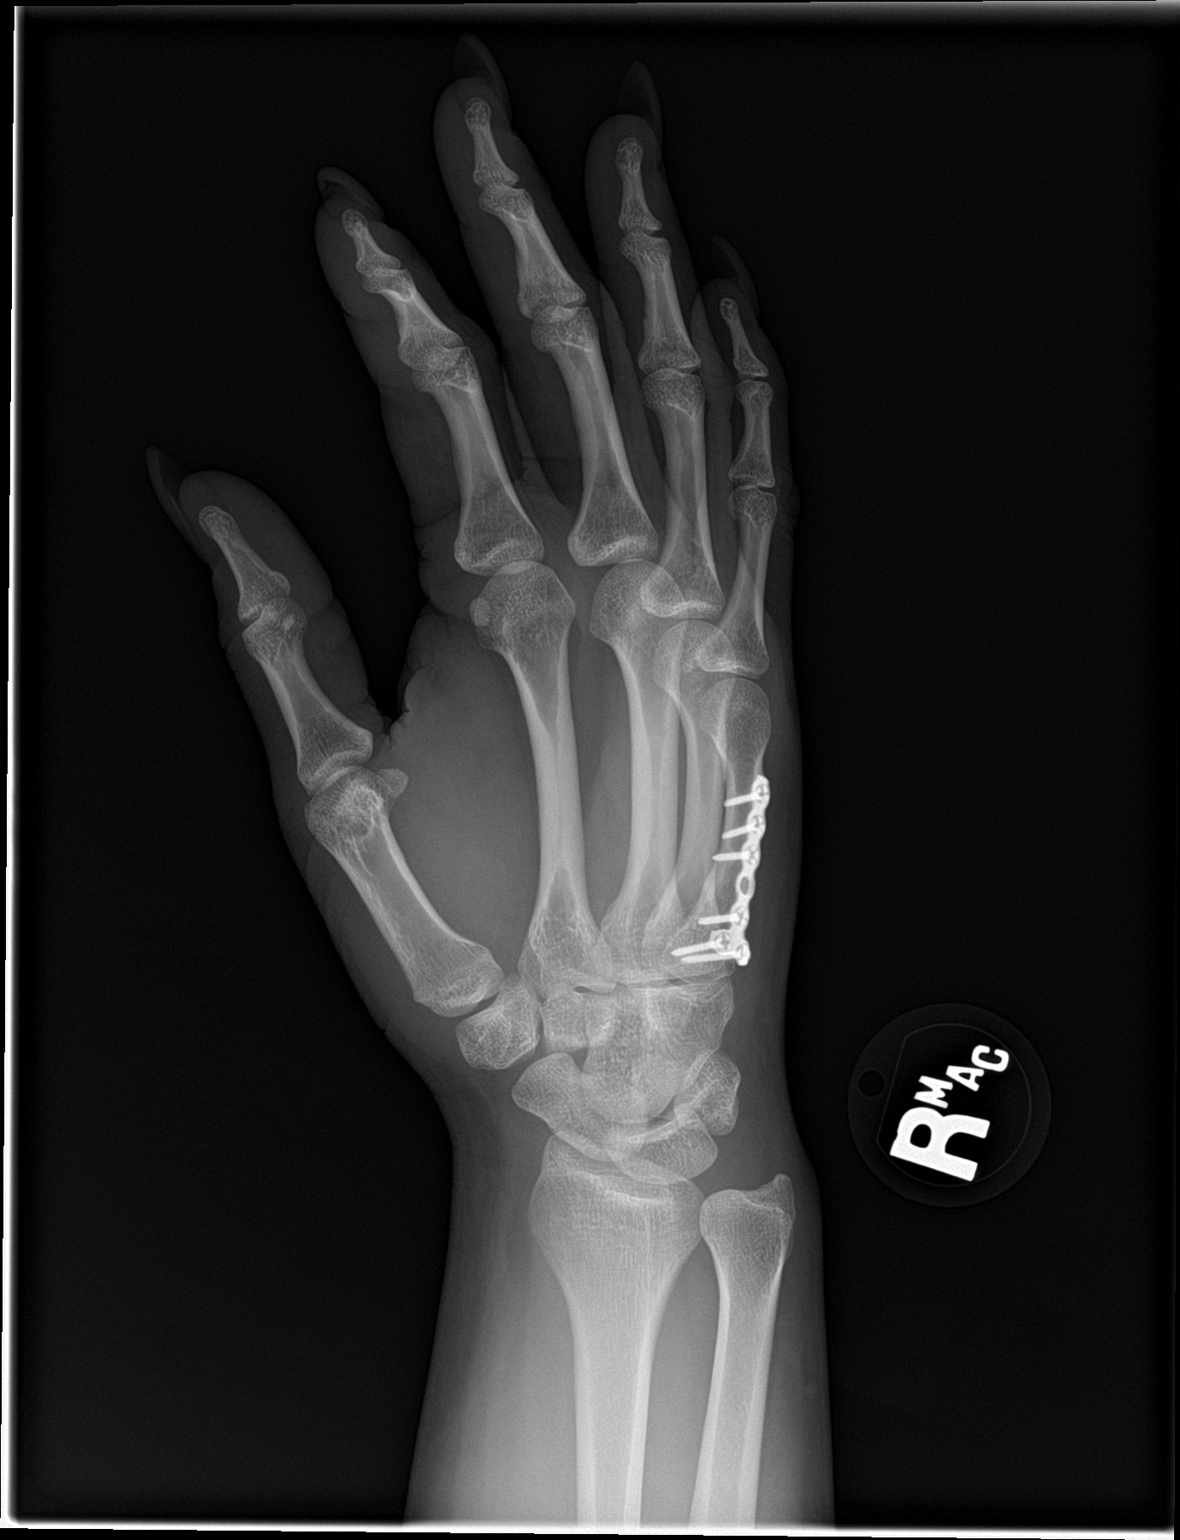

[hand lat]
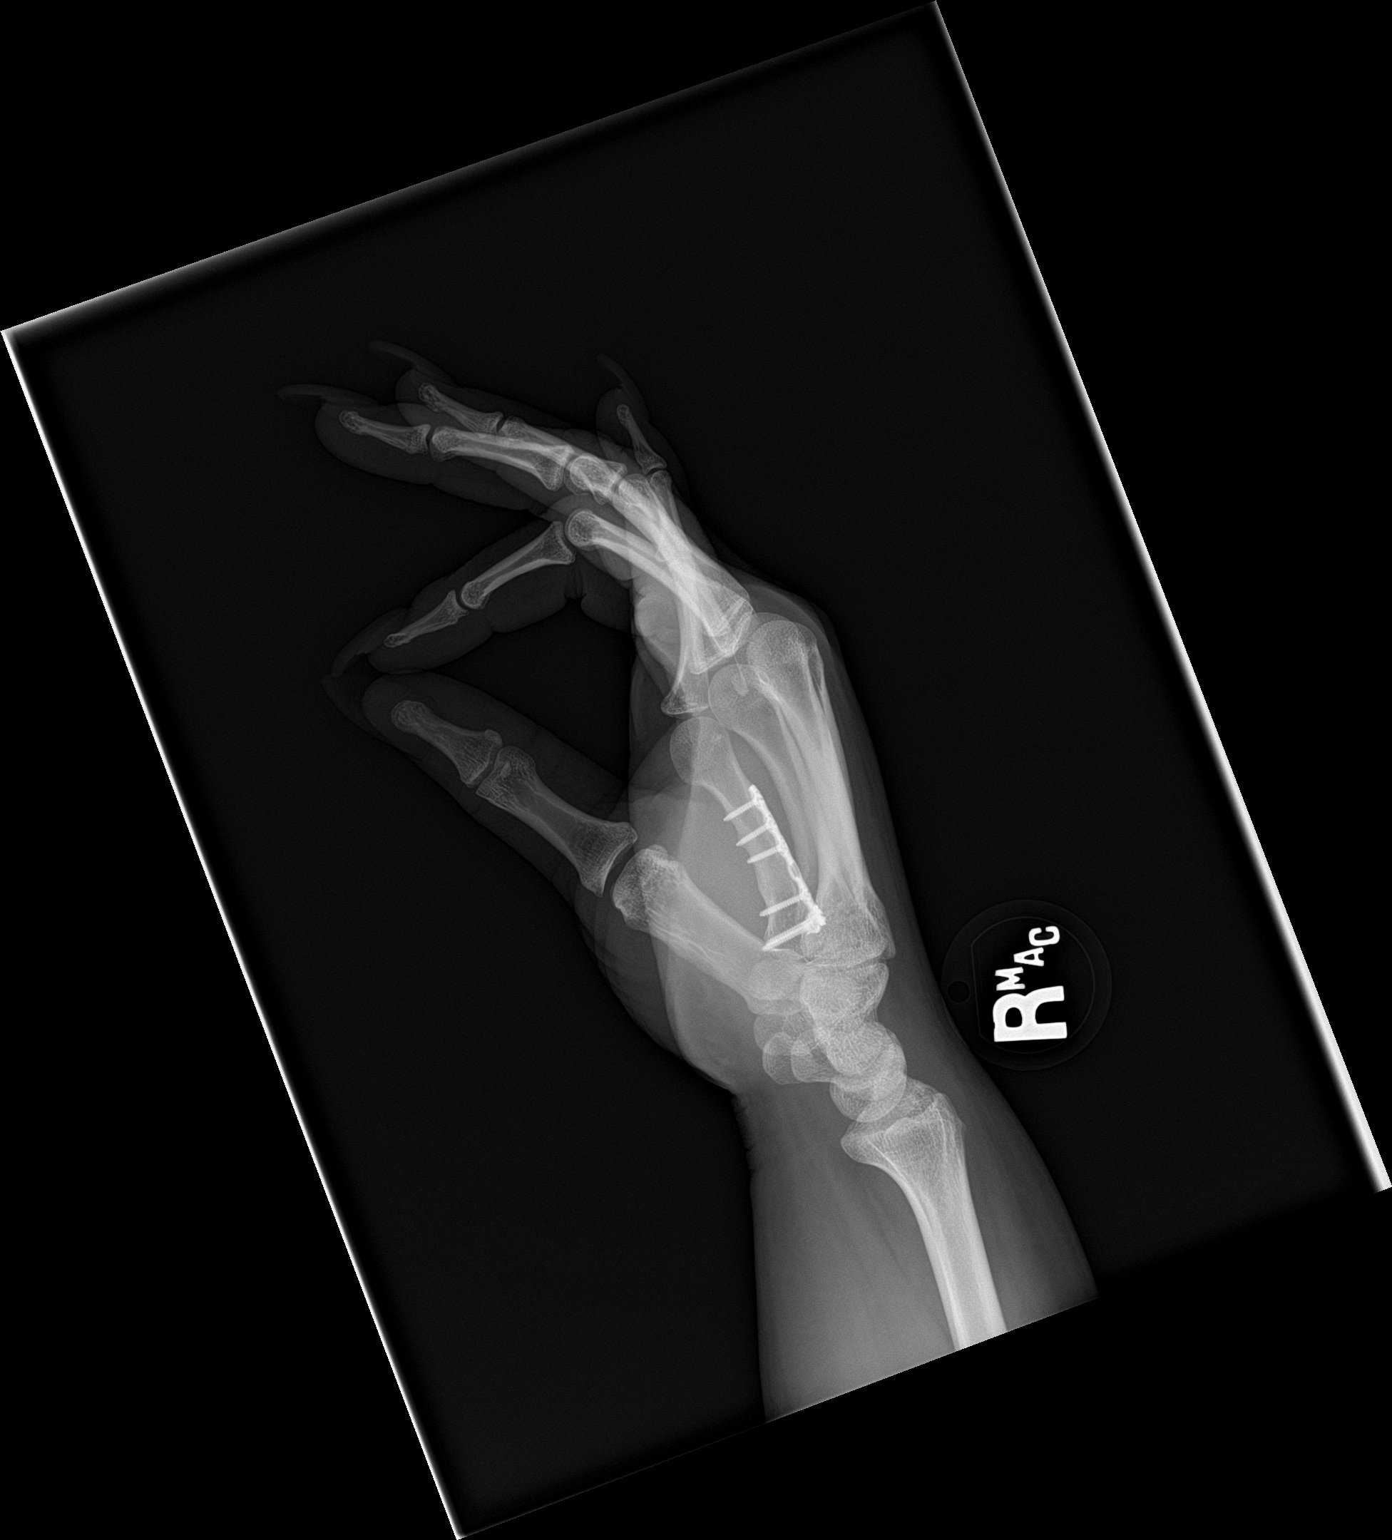

[3 of 3 positions shown; findings below may reference images not displayed]

FINDINGS: Hand: No acute fracture deformity or dislocation. Old T-plate and
screw fifth metacarpus, hardware is intact and well seated. Joint
space intact without erosions. No destructive bony lesions. Soft
tissue planes are not suspicious.

Forearm: No acute fracture deformity or dislocation. Joint space
intact without erosions. No destructive bony lesions. Soft tissue
planes are not suspicious.

Elbow: No acute fracture deformity or dislocation. Joint space
intact without erosions. No destructive bony lesions. Soft tissue
planes are not suspicious.
IMPRESSION: RIGHT hand: Negative.  Old fifth metacarpus ORIF.

RIGHT forearm:  Negative.

RIGHT elbow: Negative.

## 2018-01-19 ENCOUNTER — Other Ambulatory Visit: Payer: Self-pay

## 2018-01-19 ENCOUNTER — Emergency Department (HOSPITAL_BASED_OUTPATIENT_CLINIC_OR_DEPARTMENT_OTHER)
Admission: EM | Admit: 2018-01-19 | Discharge: 2018-01-19 | Disposition: A | Discharge status: Home / Self Care | Payer: Medicaid Other | Attending: Emergency Medicine | Admitting: Emergency Medicine

## 2018-01-19 ENCOUNTER — Encounter (HOSPITAL_BASED_OUTPATIENT_CLINIC_OR_DEPARTMENT_OTHER): Payer: Self-pay | Admitting: *Deleted

## 2018-01-19 DIAGNOSIS — B349 Viral infection, unspecified: Secondary | ICD-10-CM

## 2018-01-19 DIAGNOSIS — I10 Essential (primary) hypertension: Secondary | ICD-10-CM | POA: Diagnosis not present

## 2018-01-19 DIAGNOSIS — Z79899 Other long term (current) drug therapy: Secondary | ICD-10-CM | POA: Insufficient documentation

## 2018-01-19 DIAGNOSIS — R0981 Nasal congestion: Secondary | ICD-10-CM | POA: Diagnosis present

## 2018-01-19 MED ORDER — ONDANSETRON HCL 4 MG PO TABS: 4.0000 mg | ORAL_TABLET | Freq: Four times a day (QID) | ORAL | 0 refills | Status: DC

## 2018-01-19 MED ORDER — ACETAMINOPHEN 500 MG PO TABS: 500.0000 mg | ORAL_TABLET | Freq: Four times a day (QID) | ORAL | 0 refills | Status: DC | PRN

## 2018-01-19 MED ORDER — CETIRIZINE-PSEUDOEPHEDRINE ER 5-120 MG PO TB12
1.0000 | ORAL_TABLET | Freq: Every day | ORAL | 0 refills | Status: DC
Start: 2018-01-19 — End: 2020-07-15

## 2018-01-19 MED ORDER — IBUPROFEN 400 MG PO TABS: 400.0000 mg | ORAL_TABLET | Freq: Four times a day (QID) | ORAL | 0 refills | Status: DC | PRN

## 2018-01-19 MED ORDER — FLUTICASONE PROPIONATE 50 MCG/ACT NA SUSP: 1.0000 | Freq: Every day | NASAL | 0 refills | Status: DC

## 2018-01-19 NOTE — ED Triage Notes (Signed)
P c/o Flu like symptoms x 3 days including n/v/d

## 2018-01-19 NOTE — ED Provider Notes (Signed)
[facility] EMERGENCY DEPARTMENT Provider Note   CSN: [id number] Arrival date & time: 01/19/18  1011     History   Chief Complaint Chief Complaint  Patient presents with  . Influenza    HPI Destiny Pugh is a 27 y.o. female with history of hypertension, migraine who presents with a 3-day history of nasal congestion, rhinorrhea, nausea, and loose stools.  She has had some associated mild headache and fatigue.  She has had poor appetite.  She denies fever, cough, shortness of breath, chest pain, abdominal pain, urinary symptoms.  She has been taking NyQuil and DayQuil at home without relief.  She reports history of hypertension, however she is not on any medication because she heard that lisinopril has killed people.  HPI  Past Medical History:  Diagnosis Date  . Hypertension   . Migraine     There are no active problems to display for this patient.   Past Surgical History:  Procedure Laterality Date  . arm surgery    . CESAREAN SECTION    . CESAREAN SECTION    . FRACTURE SURGERY    . HAND SURGERY      OB History    No data available       Home Medications    Prior to Admission medications   Medication Sig Start Date End Date Taking? Authorizing Provider  acetaminophen (TYLENOL) 500 MG tablet Take 1 tablet (500 mg total) by mouth every 6 (six) hours as needed. 01/19/18   Amerigo Mcglory, Waylan Boga, PA-C  cetirizine-pseudoephedrine (ZYRTEC-D) 5-120 MG tablet Take 1 tablet by mouth daily. 01/19/18   Nethra Mehlberg, Waylan Boga, PA-C  fluticasone (FLONASE) 50 MCG/ACT nasal spray Place 1 spray into both nostrils daily. 01/19/18   Susumu Hackler, Waylan Boga, PA-C  ibuprofen (ADVIL,MOTRIN) 400 MG tablet Take 1 tablet (400 mg total) by mouth every 6 (six) hours as needed. 01/19/18   Shrika Milos, Waylan Boga, PA-C  naproxen (NAPROSYN) 500 MG tablet Take 1 tablet (500 mg total) by mouth 2 (two) times daily. 01/03/17   Mabe, Latanya Maudlin, MD  ondansetron (ZOFRAN) 4 MG tablet Take 1 tablet (4 mg total) by  mouth every 6 (six) hours. 01/19/18   Azucena Dart, Waylan Boga, PA-C  prochlorperazine (COMPAZINE) 10 MG tablet Take 1 tablet (10 mg total) by mouth 2 (two) times daily as needed. For migraine headache - take with 25mg  benadryl 11/21/12 04/08/16  Bonk, Orson Aloe, MD  promethazine (PHENERGAN) 12.5 MG suppository Place 1 suppository (12.5 mg total) rectally every 6 (six) hours as needed for nausea or vomiting. 03/17/14 04/08/16  Palumbo, April, MD    Family History History reviewed. No pertinent family history.  Social History Social History   Tobacco Use  . Smoking status: Never Smoker  . Smokeless tobacco: Never Used  Substance Use Topics  . Alcohol use: No  . Drug use: No     Allergies   Codeine   Review of Systems Review of Systems  Constitutional: Negative for chills and fever.  HENT: Positive for congestion and rhinorrhea. Negative for ear pain, facial swelling and sore throat.   Respiratory: Negative for cough and shortness of breath.   Cardiovascular: Negative for chest pain.  Gastrointestinal: Positive for diarrhea and nausea. Negative for abdominal pain and vomiting.  Genitourinary: Negative for dysuria.  Musculoskeletal: Negative for back pain.  Skin: Negative for rash and wound.  Neurological: Negative for headaches.  Psychiatric/Behavioral: The patient is not nervous/anxious.      Physical Exam Updated Vital Signs BP Marland Kitchen)  154/99 (BP Location: Left Arm)   Pulse 80   Temp 98.4 F (36.9 C) (Oral)   Resp 16   Ht 4\' 11"  (1.499 m)   Wt 77.1 kg (170 lb)   SpO2 97%   BMI 34.34 kg/m   Physical Exam  Constitutional: She appears well-developed and well-nourished. No distress.  HENT:  Head: Normocephalic and atraumatic.  Mouth/Throat: Oropharynx is clear and moist. No oropharyngeal exudate.  Eyes: Conjunctivae are normal. Pupils are equal, round, and reactive to light. Right eye exhibits no discharge. Left eye exhibits no discharge. No scleral icterus.  Neck: Normal range  of motion. Neck supple. No thyromegaly present.  Cardiovascular: Normal rate, regular rhythm, normal heart sounds and intact distal pulses. Exam reveals no gallop and no friction rub.  No murmur heard. Pulmonary/Chest: Effort normal and breath sounds normal. No stridor. No respiratory distress. She has no wheezes. She has no rales.  Abdominal: Soft. Bowel sounds are normal. She exhibits no distension. There is no tenderness. There is no rebound and no guarding.  Musculoskeletal: She exhibits no edema.  Lymphadenopathy:    She has no cervical adenopathy.  Neurological: She is alert. Coordination normal.  Skin: Skin is warm and dry. No rash noted. She is not diaphoretic. No pallor.  Psychiatric: She has a normal mood and affect.  Nursing note and vitals reviewed.    ED Treatments / Results  Labs (all labs ordered are listed, but only abnormal results are displayed) Labs Reviewed - No data to display  EKG  EKG Interpretation None       Radiology No results found.  Procedures Procedures (including critical care time)  Medications Ordered in ED Medications - No data to display   Initial Impression / Assessment and Plan / ED Course  I have reviewed the triage vital signs and the nursing notes.  Pertinent labs & imaging results that were available during my care of the patient were reviewed by me and considered in my medical decision making (see chart for details).     Pt symptoms consistent with viral syndrome.  True influenza unlikely without fever.  Lungs are clear to auscultation.  Pt will be discharged with symptomatic treatment.  Patient advised to follow-up with PCP for further treatment of hypertension.  Blood pressure decreased in the ED. Patient advised that it is important to follow-up and have her hypertension treated if it is persistently high.  Discussed return precautions.  She understands and agrees with plan.  Patient vitals stable and discharged in satisfactory  condition per  Final Clinical Impressions(s) / ED Diagnoses   Final diagnoses:  Viral syndrome    ED Discharge Orders        Ordered    cetirizine-pseudoephedrine (ZYRTEC-D) 5-120 MG tablet  Daily     01/19/18 1110    fluticasone (FLONASE) 50 MCG/ACT nasal spray  Daily     01/19/18 1110    ondansetron (ZOFRAN) 4 MG tablet  Every 6 hours     01/19/18 1110    ibuprofen (ADVIL,MOTRIN) 400 MG tablet  Every 6 hours PRN     01/19/18 1111    acetaminophen (TYLENOL) 500 MG tablet  Every 6 hours PRN     01/19/18 1111       LawWaylan Boga, Jamelle Noy M, PA-C 01/19/18 1117    Doug SouJacubowitz, Sam, MD 01/19/18 1651

## 2018-01-19 NOTE — Discharge Instructions (Signed)
Take Zyrtec-D once daily as needed for nasal congestion.  Take Flonase once daily for nasal congestion as well.  You can also find "Simply Saline" over-the-counter and use as needed for nasal congestion.  You can alternate ibuprofen and Tylenol as prescribed, as needed for headache.  Please return to the emergency department if you develop any new or worsening symptoms.

## 2018-02-25 ENCOUNTER — Encounter (HOSPITAL_BASED_OUTPATIENT_CLINIC_OR_DEPARTMENT_OTHER): Payer: Self-pay | Admitting: Emergency Medicine

## 2018-02-25 ENCOUNTER — Emergency Department (HOSPITAL_BASED_OUTPATIENT_CLINIC_OR_DEPARTMENT_OTHER)
Admission: EM | Admit: 2018-02-25 | Discharge: 2018-02-25 | Disposition: A | Discharge status: Home / Self Care | Payer: Medicaid Other | Attending: Emergency Medicine | Admitting: Emergency Medicine

## 2018-02-25 ENCOUNTER — Other Ambulatory Visit: Payer: Self-pay

## 2018-02-25 DIAGNOSIS — M791 Myalgia, unspecified site: Secondary | ICD-10-CM | POA: Diagnosis present

## 2018-02-25 DIAGNOSIS — R509 Fever, unspecified: Secondary | ICD-10-CM | POA: Insufficient documentation

## 2018-02-25 DIAGNOSIS — R6883 Chills (without fever): Secondary | ICD-10-CM | POA: Insufficient documentation

## 2018-02-25 DIAGNOSIS — R6889 Other general symptoms and signs: Secondary | ICD-10-CM

## 2018-02-25 DIAGNOSIS — R070 Pain in throat: Secondary | ICD-10-CM | POA: Diagnosis not present

## 2018-02-25 DIAGNOSIS — J111 Influenza due to unidentified influenza virus with other respiratory manifestations: Secondary | ICD-10-CM | POA: Insufficient documentation

## 2018-02-25 DIAGNOSIS — I10 Essential (primary) hypertension: Secondary | ICD-10-CM | POA: Insufficient documentation

## 2018-02-25 DIAGNOSIS — J3489 Other specified disorders of nose and nasal sinuses: Secondary | ICD-10-CM | POA: Insufficient documentation

## 2018-02-25 DIAGNOSIS — R197 Diarrhea, unspecified: Secondary | ICD-10-CM | POA: Diagnosis not present

## 2018-02-25 DIAGNOSIS — Z79899 Other long term (current) drug therapy: Secondary | ICD-10-CM | POA: Diagnosis not present

## 2018-02-25 MED ORDER — FLUTICASONE PROPIONATE 50 MCG/ACT NA SUSP: 2.0000 | Freq: Every day | NASAL | 12 refills | Status: DC

## 2018-02-25 MED ORDER — BENZONATATE 100 MG PO CAPS: 100.0000 mg | ORAL_CAPSULE | Freq: Three times a day (TID) | ORAL | 0 refills | Status: DC

## 2018-02-25 NOTE — Discharge Instructions (Signed)
Symptoms seem consistent with possible flu versus another viral infection.  I would recommend Motrin and Tylenol along with staying hydrated with tolerate or Gatorade.  Have given you Tessalon to help with your cough.  Also given Flonase to help with nasal congestion.  Follow-up with your primary care doctor return the ED if your symptoms do not improve.

## 2018-02-25 NOTE — ED Provider Notes (Signed)
[facility] EMERGENCY DEPARTMENT Provider Note   CSN: [id number] Arrival date & time: 02/25/18  1030     History   Chief Complaint Chief Complaint  Patient presents with  . Generalized Body Aches  . Sore Throat    HPI Destiny Pugh is a 27 y.o. female.  HPI 27 year old African-American female past medical history significant for migraines and hypertension presents to the emergency department today with flulike symptoms.  Specifically patient complains of body aches, chills, sore throat, cough, rhinorrhea, fevers.  Symptoms started yesterday.  She has not take any over-the-counter medications today.  But she has been trying over-the-counter Mucinex over the weekend.  Patient reports that her husband was seen in the ED last week and diagnosed with the flu.  Patient denies getting a flu shot.  She does report some diarrhea but denies any associated nausea or vomiting.  Denies any associated urinary symptoms are abdominal pain.  Patient states that she had to leave work today because she did not feel well.  Denies any associated otalgia, chest pain or shortness of breath.  The patient's children are being seen in the ED for same symptoms. Past Medical History:  Diagnosis Date  . Hypertension   . Migraine     There are no active problems to display for this patient.   Past Surgical History:  Procedure Laterality Date  . arm surgery    . CESAREAN SECTION    . CESAREAN SECTION    . FRACTURE SURGERY    . HAND SURGERY      OB History    No data available       Home Medications    Prior to Admission medications   Medication Sig Start Date End Date Taking? Authorizing Provider  acetaminophen (TYLENOL) 500 MG tablet Take 1 tablet (500 mg total) by mouth every 6 (six) hours as needed. 01/19/18   Law, Waylan BogaAlexandra M, PA-C  cetirizine-pseudoephedrine (ZYRTEC-D) 5-120 MG tablet Take 1 tablet by mouth daily. 01/19/18   Law, Waylan BogaAlexandra M, PA-C  fluticasone (FLONASE) 50  MCG/ACT nasal spray Place 1 spray into both nostrils daily. 01/19/18   Law, Waylan BogaAlexandra M, PA-C  ibuprofen (ADVIL,MOTRIN) 400 MG tablet Take 1 tablet (400 mg total) by mouth every 6 (six) hours as needed. 01/19/18   Law, Waylan BogaAlexandra M, PA-C  naproxen (NAPROSYN) 500 MG tablet Take 1 tablet (500 mg total) by mouth 2 (two) times daily. 01/03/17   Mabe, Latanya MaudlinMartha L, MD  ondansetron (ZOFRAN) 4 MG tablet Take 1 tablet (4 mg total) by mouth every 6 (six) hours. 01/19/18   Emi HolesLaw, Alexandra M, PA-C    Family History No family history on file.  Social History Social History   Tobacco Use  . Smoking status: Never Smoker  . Smokeless tobacco: Never Used  Substance Use Topics  . Alcohol use: No  . Drug use: No     Allergies   Codeine   Review of Systems Review of Systems  All other systems reviewed and are negative.    Physical Exam Updated Vital Signs BP (!) 144/104 (BP Location: Left Arm)   Pulse 78   Temp 98.3 F (36.8 C) (Oral)   Resp 16   Ht 4\' 11"  (1.499 m)   Wt 81.6 kg (180 lb)   SpO2 99%   BMI 36.36 kg/m   Physical Exam  Constitutional: She appears well-developed and well-nourished. No distress.  HENT:  Head: Normocephalic and atraumatic.  Right Ear: Tympanic membrane and ear canal normal. No  drainage.  Left Ear: Tympanic membrane and ear canal normal. No drainage.  Mouth/Throat: Uvula is midline and mucous membranes are normal. No uvula swelling. Posterior oropharyngeal erythema present. No oropharyngeal exudate, posterior oropharyngeal edema or tonsillar abscesses. Tonsils are 1+ on the right. Tonsils are 1+ on the left. No tonsillar exudate.  Eyes: Right eye exhibits no discharge. Left eye exhibits no discharge. No scleral icterus.  Neck: Normal range of motion. Neck supple.  Cardiovascular: Normal rate, regular rhythm and normal heart sounds. Exam reveals no friction rub.  No murmur heard. Pulmonary/Chest: Effort normal and breath sounds normal. No stridor. No respiratory  distress. She has no wheezes. She has no rhonchi. She has no rales. She exhibits no tenderness.  Musculoskeletal: Normal range of motion.  Lymphadenopathy:    She has no cervical adenopathy.  Neurological: She is alert.  Skin: Skin is warm and dry. Capillary refill takes less than 2 seconds. No pallor.  Psychiatric: Her behavior is normal. Judgment and thought content normal.  Nursing note and vitals reviewed.    ED Treatments / Results  Labs (all labs ordered are listed, but only abnormal results are displayed) Labs Reviewed - No data to display  EKG  EKG Interpretation None       Radiology No results found.  Procedures Procedures (including critical care time)  Medications Ordered in ED Medications - No data to display   Initial Impression / Assessment and Plan / ED Course  I have reviewed the triage vital signs and the nursing notes.  Pertinent labs & imaging results that were available during my care of the patient were reviewed by me and considered in my medical decision making (see chart for details).     Patient with symptoms consistent with influenza.  Vitals are stable, low-grade fever.  No signs of dehydration, tolerating PO's.  Lungs are clear. Due to patient's presentation and physical exam a chest x-ray was not ordered bc likely diagnosis of flu.  Discussed the cost versus benefit of Tamiflu treatment with the patient.  Offered Tamiflu the patient however she would like to avoid at this time.  Patient's Centor score is -1.  Do not feel that strep test is indicated at this time.  Patient will be discharged with instructions to orally hydrate, rest, and use over-the-counter medications such as anti-inflammatories ibuprofen and Aleve for muscle aches and Tylenol for fever.  Patient will also be given a cough suppressant.   Pt is hemodynamically stable, in NAD, & able to ambulate in the ED. Evaluation does not show pathology that would require ongoing emergent  intervention or inpatient treatment. I explained the diagnosis to the patient. Pain has been managed & has no complaints prior to dc. Pt is comfortable with above plan and is stable for discharge at this time. All questions were answered prior to disposition. Strict return precautions for f/u to the ED were discussed. Encouraged follow up with PCP.    Final Clinical Impressions(s) / ED Diagnoses   Final diagnoses:  Flu-like symptoms    ED Discharge Orders    None       Wallace Keller 02/25/18 1145    Cardama, Amadeo Garnet, MD 02/25/18 1504

## 2018-02-25 NOTE — ED Triage Notes (Signed)
Pt c/o body aches, chills, sore throat since yesterday; no OTC meds today

## 2020-04-09 ENCOUNTER — Encounter (HOSPITAL_BASED_OUTPATIENT_CLINIC_OR_DEPARTMENT_OTHER): Payer: Self-pay | Admitting: Emergency Medicine

## 2020-04-09 ENCOUNTER — Other Ambulatory Visit: Payer: Self-pay

## 2020-04-09 ENCOUNTER — Emergency Department (HOSPITAL_BASED_OUTPATIENT_CLINIC_OR_DEPARTMENT_OTHER)
Admission: EM | Admit: 2020-04-09 | Discharge: 2020-04-10 | Disposition: A | Discharge status: Home / Self Care | Payer: Managed Care, Other (non HMO) | Attending: Emergency Medicine | Admitting: Emergency Medicine

## 2020-04-09 ENCOUNTER — Emergency Department (HOSPITAL_BASED_OUTPATIENT_CLINIC_OR_DEPARTMENT_OTHER): Payer: Managed Care, Other (non HMO)

## 2020-04-09 DIAGNOSIS — R05 Cough: Secondary | ICD-10-CM | POA: Diagnosis present

## 2020-04-09 DIAGNOSIS — U071 COVID-19: Secondary | ICD-10-CM | POA: Insufficient documentation

## 2020-04-09 DIAGNOSIS — I10 Essential (primary) hypertension: Secondary | ICD-10-CM | POA: Insufficient documentation

## 2020-04-09 DIAGNOSIS — R430 Anosmia: Secondary | ICD-10-CM | POA: Diagnosis not present

## 2020-04-09 DIAGNOSIS — R509 Fever, unspecified: Secondary | ICD-10-CM | POA: Insufficient documentation

## 2020-04-09 DIAGNOSIS — Z79899 Other long term (current) drug therapy: Secondary | ICD-10-CM | POA: Diagnosis not present

## 2020-04-09 LAB — COMPREHENSIVE METABOLIC PANEL
ALT: 40 U/L (ref 0–44)
AST: 28 U/L (ref 15–41)
Albumin: 3.9 g/dL (ref 3.5–5.0)
Alkaline Phosphatase: 49 U/L (ref 38–126)
Anion gap: 12 (ref 5–15)
BUN: 6 mg/dL (ref 6–20)
CO2: 23 mmol/L (ref 22–32)
Calcium: 8.7 mg/dL — ABNORMAL LOW (ref 8.9–10.3)
Chloride: 100 mmol/L (ref 98–111)
Creatinine, Ser: 0.79 mg/dL (ref 0.44–1.00)
GFR calc Af Amer: >60 mL/min (ref >60)
GFR calc non Af Amer: >60 mL/min (ref >60)
Glucose, Bld: 148 mg/dL — ABNORMAL HIGH (ref 70–99)
Potassium: 2.9 mmol/L — ABNORMAL LOW (ref 3.5–5.1)
Sodium: 135 mmol/L (ref 135–145)
Total Bilirubin: 0.5 mg/dL (ref 0.3–1.2)
Total Protein: 7.7 g/dL (ref 6.5–8.1)

## 2020-04-09 LAB — SARS CORONAVIRUS 2 AG (30 MIN TAT): SARS Coronavirus 2 Ag: POSITIVE — AB

## 2020-04-09 LAB — CBC WITH DIFFERENTIAL/PLATELET
Abs Immature Granulocytes: 0.02 10*3/uL (ref 0.00–0.07)
Basophils Absolute: 0 10*3/uL (ref 0.0–0.1)
Basophils Relative: 0 %
Eosinophils Absolute: 0 10*3/uL (ref 0.0–0.5)
Eosinophils Relative: 0 %
HCT: 40.1 % (ref 36.0–46.0)
Hemoglobin: 13.2 g/dL (ref 12.0–15.0)
Immature Granulocytes: 0 %
Lymphocytes Relative: 8 %
Lymphs Abs: 0.6 10*3/uL — ABNORMAL LOW (ref 0.7–4.0)
MCH: 26.6 pg (ref 26.0–34.0)
MCHC: 32.9 g/dL (ref 30.0–36.0)
MCV: 80.7 fL (ref 80.0–100.0)
Monocytes Absolute: 0.5 10*3/uL (ref 0.1–1.0)
Monocytes Relative: 6 %
Neutro Abs: 6.9 10*3/uL (ref 1.7–7.7)
Neutrophils Relative %: 86 %
Platelets: 214 10*3/uL (ref 150–400)
RBC: 4.97 MIL/uL (ref 3.87–5.11)
RDW: 13.7 % (ref 11.5–15.5)
WBC: 8 10*3/uL (ref 4.0–10.5)
nRBC: 0 % (ref 0.0–0.2)

## 2020-04-09 LAB — CBG MONITORING, ED: Glucose-Capillary: 139 mg/dL — ABNORMAL HIGH (ref 70–99)

## 2020-04-09 MED ORDER — IBUPROFEN 800 MG PO TABS
800.0000 mg | ORAL_TABLET | Freq: Once | ORAL | Status: AC
  Administered 2020-04-09: 800 mg via ORAL
  Filled 2020-04-09: qty 1

## 2020-04-09 MED ORDER — ACETAMINOPHEN 325 MG PO TABS
650.0000 mg | ORAL_TABLET | Freq: Once | ORAL | Status: AC
  Administered 2020-04-09: 650 mg via ORAL
  Filled 2020-04-09: qty 2

## 2020-04-09 MED ORDER — SODIUM CHLORIDE 0.9 % IV BOLUS
1000.0000 mL | Freq: Once | INTRAVENOUS | Status: AC
  Administered 2020-04-09: 1000 mL via INTRAVENOUS

## 2020-04-09 NOTE — ED Provider Notes (Signed)
[facility] Provider Note   CSN: [id number] Arrival date & time: 04/09/20  1909     History Chief Complaint  Patient presents with  . Cough    Destiny Pugh is a 29 y.o. female.  Patient is a 29 year old female with no significant past medical history.  She presents today for evaluation of fever, chills, cough, loss of taste and smell for the past 24 hours.  Patient reports being exposed to a coworker with COVID-19.  The history is provided by the patient.  Cough Cough characteristics:  Non-productive Severity:  Moderate Duration:  24 hours Timing:  Constant Progression:  Worsening Chronicity:  New Smoker: no   Relieved by:  Nothing Worsened by:  Nothing Associated symptoms: chills, fever, headaches and myalgias   Associated symptoms: no chest pain        Past Medical History:  Diagnosis Date  . Hypertension   . Migraine     There are no problems to display for this patient.   Past Surgical History:  Procedure Laterality Date  . arm surgery    . CESAREAN SECTION    . CESAREAN SECTION    . FRACTURE SURGERY    . HAND SURGERY       OB History   No obstetric history on file.     History reviewed. No pertinent family history.  Social History   Tobacco Use  . Smoking status: Never Smoker  . Smokeless tobacco: Never Used  Substance Use Topics  . Alcohol use: No  . Drug use: No    Home Medications Prior to Admission medications   Medication Sig Start Date End Date Taking? Authorizing Provider  acetaminophen (TYLENOL) 500 MG tablet Take 1 tablet (500 mg total) by mouth every 6 (six) hours as needed. 01/19/18   Law, Bea Graff, PA-C  benzonatate (TESSALON) 100 MG capsule Take 1 capsule (100 mg total) by mouth every 8 (eight) hours. 02/25/18   Doristine Devoid, PA-C  cetirizine-pseudoephedrine (ZYRTEC-D) 5-120 MG tablet Take 1 tablet by mouth daily. 01/19/18   Law, Bea Graff, PA-C  fluticasone (FLONASE) 50 MCG/ACT  nasal spray Place 2 sprays into both nostrils daily. 02/25/18   Doristine Devoid, PA-C  ibuprofen (ADVIL,MOTRIN) 400 MG tablet Take 1 tablet (400 mg total) by mouth every 6 (six) hours as needed. 01/19/18   Law, Bea Graff, PA-C  naproxen (NAPROSYN) 500 MG tablet Take 1 tablet (500 mg total) by mouth 2 (two) times daily. 01/03/17   Mabe, Forbes Cellar, MD  ondansetron (ZOFRAN) 4 MG tablet Take 1 tablet (4 mg total) by mouth every 6 (six) hours. 01/19/18   Frederica Kuster, PA-C    Allergies    Codeine  Review of Systems   Review of Systems  Constitutional: Positive for chills and fever.  Respiratory: Positive for cough.   Cardiovascular: Negative for chest pain.  Musculoskeletal: Positive for myalgias.  Neurological: Positive for headaches.  All other systems reviewed and are negative.   Physical Exam Updated Vital Signs BP (!) 137/92 (BP Location: Right Arm)   Pulse (!) 108   Temp (!) 100.9 F (38.3 C)   Resp 18   Ht 4\' 11"  (1.499 m)   Wt 80.3 kg   LMP 03/23/2020 (Approximate)   SpO2 99%   BMI 35.75 kg/m   Physical Exam Vitals and nursing note reviewed.  Constitutional:      General: She is not in acute distress.    Appearance: She is well-developed. She  is not diaphoretic.  HENT:     Head: Normocephalic and atraumatic.  Cardiovascular:     Rate and Rhythm: Normal rate and regular rhythm.     Heart sounds: No murmur. No friction rub. No gallop.   Pulmonary:     Effort: Pulmonary effort is normal. No respiratory distress.     Breath sounds: Normal breath sounds. No wheezing.  Abdominal:     General: Bowel sounds are normal. There is no distension.     Palpations: Abdomen is soft.     Tenderness: There is no abdominal tenderness.  Musculoskeletal:        General: Normal range of motion.     Cervical back: Normal range of motion and neck supple.     Right lower leg: No edema.     Left lower leg: No edema.  Skin:    General: Skin is warm and dry.  Neurological:      Mental Status: She is alert and oriented to person, place, and time.     ED Results / Procedures / Treatments   Labs (all labs ordered are listed, but only abnormal results are displayed) Labs Reviewed  CBG MONITORING, ED - Abnormal; Notable for the following components:      Result Value   Glucose-Capillary 139 (*)    All other components within normal limits  RESPIRATORY PANEL BY RT PCR (FLU A&B, COVID)  COMPREHENSIVE METABOLIC PANEL  CBC WITH DIFFERENTIAL/PLATELET    EKG EKG Interpretation  Date/Time:  Saturday Apr 09 2020 21:14:34 EDT Ventricular Rate:  96 PR Interval:    QRS Duration: 75 QT Interval:  349 QTC Calculation: 441 R Axis:   42 Text Interpretation: Sinus rhythm Normal ECG Confirmed by Geoffery Lyons ([id number]) on 04/09/2020 9:41:23 PM   Radiology No results found.  Procedures Procedures (including critical care time)  Medications Ordered in ED Medications  sodium chloride 0.9 % bolus 1,000 mL (has no administration in time range)  acetaminophen (TYLENOL) tablet 650 mg (650 mg Oral Given 04/09/20 1941)    ED Course  I have reviewed the triage vital signs and the nursing notes.  Pertinent labs & imaging results that were available during my care of the patient were reviewed by me and considered in my medical decision making (see chart for details).    MDM Rules/Calculators/A&P  Patient presenting with complaints of cough, fever, body aches, and chills.  She was recently exposed to a coworker with COVID-19.  Patient's laboratory studies are essentially unremarkable and chest x-ray is clear.  Her Covid test did return as positive.  Her vital signs are stable and oxygen saturations are 100%.  She is in no respiratory distress.  At this point, I feel as though discharge is appropriate.  Patient will be discharged with alternating Tylenol and Motrin and as needed return.  Final Clinical Impression(s) / ED Diagnoses Final diagnoses:  None    Rx / DC  Orders ED Discharge Orders    None       Geoffery Lyons, MD 04/09/20 2359

## 2020-04-09 NOTE — ED Notes (Signed)
Patient's name was called multiple times in wtg area for imaging in triage room, no response.

## 2020-04-09 NOTE — ED Triage Notes (Signed)
Pt c/o cough, loss of taste, weakness, fever chills x 2 days. Pt reports Covid exposure. Pt denies otc meds pta.

## 2020-04-09 NOTE — ED Notes (Signed)
Dr. Judd Lien aware of positive covid

## 2020-04-10 ENCOUNTER — Telehealth: Payer: Self-pay | Admitting: Physician Assistant

## 2020-04-10 NOTE — Telephone Encounter (Signed)
Called to discuss with patient about Covid symptoms and the use of bamlanivimab/etesevimab or casirivimab/imdevimab, a monoclonal antibody infusion for those with mild to moderate Covid symptoms and at a high risk of hospitalization.  Pt is qualified for this infusion at the [facility] infusion center due to [employer] was full.   Cline Crock PA-C  [facility]

## 2020-04-10 NOTE — Discharge Instructions (Addendum)
Take tylenol 1000 mg rotated with ibuprofen 600 mg every four hours as needed for pain or fever.    Drink plenty of fluids and get plenty of rest.  Return to the emergency department if you develop difficulty breathing, severe chest pain, or other new and concerning symptoms.

## 2020-07-14 ENCOUNTER — Other Ambulatory Visit: Payer: Self-pay

## 2020-07-14 ENCOUNTER — Encounter (HOSPITAL_BASED_OUTPATIENT_CLINIC_OR_DEPARTMENT_OTHER): Payer: Self-pay | Admitting: *Deleted

## 2020-07-14 DIAGNOSIS — I10 Essential (primary) hypertension: Secondary | ICD-10-CM | POA: Diagnosis not present

## 2020-07-14 DIAGNOSIS — Z20822 Contact with and (suspected) exposure to covid-19: Secondary | ICD-10-CM | POA: Diagnosis not present

## 2020-07-14 DIAGNOSIS — R05 Cough: Secondary | ICD-10-CM | POA: Diagnosis present

## 2020-07-14 DIAGNOSIS — J069 Acute upper respiratory infection, unspecified: Secondary | ICD-10-CM | POA: Insufficient documentation

## 2020-07-14 LAB — SARS CORONAVIRUS 2 BY RT PCR (HOSPITAL ORDER, PERFORMED IN ~~LOC~~ HOSPITAL LAB): SARS Coronavirus 2: NEGATIVE

## 2020-07-14 NOTE — ED Triage Notes (Signed)
Loss of taste and loss of smell x 2 days. Fatigue, congestion, runny nose, cough and sore throat.

## 2020-07-15 ENCOUNTER — Emergency Department (HOSPITAL_BASED_OUTPATIENT_CLINIC_OR_DEPARTMENT_OTHER)
Admission: EM | Admit: 2020-07-15 | Discharge: 2020-07-15 | Discharge status: Against Medical Advice | Payer: BC Managed Care – PPO | Attending: Emergency Medicine | Admitting: Emergency Medicine

## 2020-07-15 DIAGNOSIS — J069 Acute upper respiratory infection, unspecified: Secondary | ICD-10-CM

## 2020-07-15 DIAGNOSIS — Z20822 Contact with and (suspected) exposure to covid-19: Secondary | ICD-10-CM

## 2020-07-15 MED ORDER — OXYMETAZOLINE HCL 0.05 % NA SOLN
2.0000 | Freq: Two times a day (BID) | NASAL | Status: DC | PRN
  Administered 2020-07-15: 2 via NASAL
  Filled 2020-07-15: qty 30

## 2020-07-15 MED ORDER — ALBUTEROL SULFATE HFA 108 (90 BASE) MCG/ACT IN AERS
2.0000 | INHALATION_SPRAY | RESPIRATORY_TRACT | Status: DC | PRN
  Administered 2020-07-15: 2 via RESPIRATORY_TRACT
  Filled 2020-07-15: qty 6.7

## 2020-07-15 NOTE — ED Notes (Signed)
Called again  No response from lobby  

## 2020-07-15 NOTE — ED Notes (Signed)
RT at bedside.

## 2020-07-15 NOTE — ED Notes (Signed)
Pt called on the phone and states she has been outside in her vehicle waiting for a room this entire time  Instructed pt to come into the lobby and she would be the next pt taken to a room

## 2020-07-15 NOTE — ED Notes (Signed)
Called pt   No response from lobby  

## 2020-07-15 NOTE — ED Provider Notes (Signed)
[facility] Provider Note: Destiny Dell, MD, FACEP  CSN: [id number] MRN: [id number] ARRIVAL: 07/14/20 at 2135 ROOM: MH09/MH09   CHIEF COMPLAINT  URI   HISTORY OF PRESENT ILLNESS  07/15/20 3:21 AM Destiny Pugh is a 29 y.o. female tested positive for Covid 04/09/2020 after being seen in the ED for fever, chills, cough, loss of taste and smell for 24 hours.  She had been exposed to Covid positive coworker.  She is here now with 2 days of loss of taste and smell along with fatigue, nasal congestion, rhinorrhea, occasional cough, sore throat early on that has resolved and shortness of breath.  The shortness of breath is moderate and she is having difficulty taking deep breaths.  She has been taking over-the-counter Delsym without adequate relief.   Past Medical History:  Diagnosis Date  . Hypertension   . Migraine     Past Surgical History:  Procedure Laterality Date  . arm surgery    . CESAREAN SECTION    . CESAREAN SECTION    . FRACTURE SURGERY    . HAND SURGERY      No family history on file.  Social History   Tobacco Use  . Smoking status: Never Smoker  . Smokeless tobacco: Never Used  Substance Use Topics  . Alcohol use: No  . Drug use: No    Prior to Admission medications   Medication Sig Start Date End Date Taking? Authorizing Provider  fluticasone (FLONASE) 50 MCG/ACT nasal spray Place 2 sprays into both nostrils daily. 02/25/18 07/15/20  Rise Mu, PA-C    Allergies Codeine   REVIEW OF SYSTEMS  Negative except as noted here or in the History of Present Illness.   PHYSICAL EXAMINATION  Initial Vital Signs Blood pressure 131/90, pulse 88, temperature 98.3 F (36.8 C), temperature source Oral, resp. rate 14, height 4' 11.5" (1.511 m), weight 80.3 kg, SpO2 100 %.  Examination General: Well-developed, well-nourished female in no acute distress; appearance consistent with age of record HENT: normocephalic; atraumatic; no  pharyngeal erythema or exudates; nasal congestion Eyes: pupils equal, round and reactive to light; extraocular muscles intact Neck: supple Heart: regular rate and rhythm Lungs: clear to auscultation bilaterally but shallow breaths Abdomen: soft; nondistended; nontender; bowel sounds present Extremities: No deformity; full range of motion; pulses normal Neurologic: Awake, alert and oriented; motor function intact in all extremities and symmetric; no facial droop Skin: Warm and dry Psychiatric: Normal mood and affect   RESULTS  Summary of this visit's results, reviewed and interpreted by myself:   EKG Interpretation  Date/Time:    Ventricular Rate:    PR Interval:    QRS Duration:   QT Interval:    QTC Calculation:   R Axis:     Text Interpretation:        Laboratory Studies: Results for orders placed or performed during the hospital encounter of 07/15/20 (from the past 24 hour(s))  SARS Coronavirus 2 by RT PCR (hospital order, performed in [facility] hospital lab) Nasopharyngeal Nasopharyngeal Swab     Status: None   Collection Time: 07/14/20 10:05 PM   Specimen: Nasopharyngeal Swab  Result Value Ref Range   SARS Coronavirus 2 NEGATIVE NEGATIVE   Imaging Studies: No results found.  ED COURSE and MDM  Nursing notes, initial and subsequent vitals signs, including pulse oximetry, reviewed and interpreted by myself.  Vitals:   07/14/20 2157 07/14/20 2159 07/15/20 0313  BP:  134/89 131/90  Pulse:  87 88  Resp:  14 14  Temp:  98.9 F (37.2 C) 98.3 F (36.8 C)  TempSrc:  Oral Oral  SpO2:  100% 100%  Weight: 80.3 kg    Height: 4' 11.5" (1.511 m)     Medications  albuterol (VENTOLIN HFA) 108 (90 Base) MCG/ACT inhaler 2 puff (has no administration in time range)  oxymetazoline (AFRIN) 0.05 % nasal spray 2 spray (has no administration in time range)    Patient's Covid test is negative.  Her breathing is shallow and she is having difficulty taking full breaths.  We  will provide her with an albuterol inhaler and AeroChamber and instruct her in their use.  She was advised she may continue over-the-counter cough medicine as well.  PROCEDURES  Procedures   ED DIAGNOSES     ICD-10-CM   1. Viral URI with cough  J06.9   2. COVID-19 virus not detected  Z03.818        Cordarrell Sane, Jonny Ruiz, MD 07/15/20 [phone]

## 2023-05-03 ENCOUNTER — Other Ambulatory Visit: Payer: Self-pay

## 2023-05-03 ENCOUNTER — Encounter (HOSPITAL_BASED_OUTPATIENT_CLINIC_OR_DEPARTMENT_OTHER): Payer: Self-pay

## 2023-05-03 ENCOUNTER — Emergency Department (HOSPITAL_BASED_OUTPATIENT_CLINIC_OR_DEPARTMENT_OTHER)
Admission: EM | Admit: 2023-05-03 | Discharge: 2023-05-03 | Disposition: A | Discharge status: Home / Self Care | Payer: No Typology Code available for payment source | Attending: Emergency Medicine | Admitting: Emergency Medicine

## 2023-05-03 DIAGNOSIS — M5412 Radiculopathy, cervical region: Secondary | ICD-10-CM | POA: Insufficient documentation

## 2023-05-03 DIAGNOSIS — M542 Cervicalgia: Secondary | ICD-10-CM | POA: Diagnosis present

## 2023-05-03 DIAGNOSIS — I10 Essential (primary) hypertension: Secondary | ICD-10-CM | POA: Diagnosis not present

## 2023-05-03 MED ORDER — NAPROXEN 375 MG PO TABS: ORAL_TABLET | ORAL | 0 refills | Status: AC

## 2023-05-03 MED ORDER — NAPROXEN 250 MG PO TABS
500.0000 mg | ORAL_TABLET | Freq: Once | ORAL | Status: AC
  Administered 2023-05-03: 500 mg via ORAL
  Filled 2023-05-03: qty 2

## 2023-05-03 NOTE — ED Triage Notes (Signed)
Complaining of numbness in the left arm that started around 7 pm. Denies any injury.

## 2023-05-03 NOTE — ED Notes (Addendum)
Primary nurse spoke to the pt about discharge instructions, during the conversation, pt expressed concern for possible stroke and/or heart attack. EDP notified and discussed with pt.

## 2023-05-03 NOTE — ED Provider Notes (Addendum)
[facility] Provider Note: [name], MD, FACEP  CSN: [id number] MRN: [id number] ARRIVAL: 05/03/23 at 0140 ROOM: MH01/MH01   CHIEF COMPLAINT  arm numbness   HISTORY OF PRESENT ILLNESS  05/03/23 1:52 AM Destiny Pugh is a 32 y.o. female who developed a burning pain in the left side of her neck and shoulder radiating down her left arm and about the C8 dermatome.  She is having paresthesias of the left fourth and fifth fingers.  She states her arm feels somewhat heavy but she is still able to move it without difficulty.  She denies injury to her neck.  Movement of the neck does not change her symptoms.  She also feels tightness in her left upper chest associated with this.   Past Medical History:  Diagnosis Date   Hypertension    Migraine     Past Surgical History:  Procedure Laterality Date   arm surgery     CESAREAN SECTION     CESAREAN SECTION     FRACTURE SURGERY     HAND SURGERY      History reviewed. No pertinent family history.  Social History   Tobacco Use   Smoking status: Never   Smokeless tobacco: Never  Substance Use Topics   Alcohol use: No   Drug use: No    Prior to Admission medications   Medication Sig Start Date End Date Taking? Authorizing Provider  naproxen (NAPROSYN) 375 MG tablet Take 1 tablet twice daily for neck and arm pain. 05/03/23  Yes Jinna Weinman, MD  fluticasone (FLONASE) 50 MCG/ACT nasal spray Place 2 sprays into both nostrils daily. 02/25/18 07/15/20  Rise Mu, PA-C    Allergies Codeine   REVIEW OF SYSTEMS  Negative except as noted here or in the History of Present Illness.   PHYSICAL EXAMINATION  Initial Vital Signs Blood pressure (!) 165/101, pulse 67, temperature 98.7 F (37.1 C), temperature source Oral, resp. rate 18, height 4\' 11"  (1.499 m), weight 74.8 kg, SpO2 99 %.  Examination General: Well-developed, well-nourished female in no acute distress; appearance consistent with age of  record HENT: normocephalic; atraumatic Eyes:  Neck: supple; no change in symptoms with movement of neck; mild tenderness of left side of neck Heart: regular rate and rhythm Lungs: clear to auscultation bilaterally Abdomen: soft; nondistended; nontender; bowel sounds present Extremities: No deformity; full range of motion; pulses normal Neurologic: Awake, alert and oriented; motor function intact in all extremities and symmetric; no facial droop Skin: Warm and dry Psychiatric: Normal mood and affect   RESULTS  Summary of this visit's results, reviewed and interpreted by myself:   Date: 05/03/2023 2:18 AM  Rate: 64  Rhythm: normal sinus rhythm  QRS Axis: normal  Intervals: normal  ST/T Wave abnormalities: normal  Conduction Disutrbances: none  Narrative Interpretation: unremarkable  Comparison with previous EKG: No significant changes  Laboratory Studies: No results found for this or any previous visit (from the past 24 hour(s)). Imaging Studies: No results found.  ED COURSE and MDM  Nursing notes, initial and subsequent vitals signs, including pulse oximetry, reviewed and interpreted by myself.  Vitals:   05/03/23 0147 05/03/23 0149  BP: (!) 165/101   Pulse: 67   Resp: 18   Temp: 98.7 F (37.1 C)   TempSrc: Oral   SpO2: 99%   Weight:  74.8 kg  Height:  4\' 11"  (1.499 m)   Medications  naproxen (NAPROSYN) tablet 500 mg (has no administration in time range)  Symptoms are consistent with a cervical radiculopathy at C8.  Her pain is not severe.  We will start her on an NSAID and refer to neurosurgery if symptoms persist.  PROCEDURES  Procedures   ED DIAGNOSES     ICD-10-CM   1. Cervical radiculopathy at C8  M54.12          Jesiah Yerby, Jonny Ruiz, MD 05/03/23 4132    Paula Libra, MD 05/03/23 [phone]
